# Patient Record
Sex: Female | Born: 1955
Health system: Southern US, Community
[De-identification: ages and names within clinical notes are randomized; demographics above are authoritative.]

## PROBLEM LIST (undated history)

## (undated) ENCOUNTER — Ambulatory Visit: Admission: EM | Source: Home / Self Care

## (undated) DIAGNOSIS — M1990 Unspecified osteoarthritis, unspecified site: Secondary | ICD-10-CM

## (undated) DIAGNOSIS — R011 Cardiac murmur, unspecified: Secondary | ICD-10-CM

## (undated) DIAGNOSIS — E785 Hyperlipidemia, unspecified: Secondary | ICD-10-CM

## (undated) DIAGNOSIS — T7840XA Allergy, unspecified, initial encounter: Secondary | ICD-10-CM

## (undated) HISTORY — PX: ABDOMINAL HYSTERECTOMY: SHX81

## (undated) HISTORY — DX: Allergy, unspecified, initial encounter: T78.40XA

## (undated) HISTORY — DX: Cardiac murmur, unspecified: R01.1

## (undated) HISTORY — PX: BUNIONECTOMY: SHX129

## (undated) HISTORY — DX: Hyperlipidemia, unspecified: E78.5

## (undated) HISTORY — PX: KNEE ARTHROSCOPY: SUR90

---

## 2013-02-25 ENCOUNTER — Ambulatory Visit (INDEPENDENT_AMBULATORY_CARE_PROVIDER_SITE_OTHER): Payer: 59 | Admitting: Internal Medicine

## 2013-02-25 ENCOUNTER — Encounter: Payer: Self-pay | Admitting: Internal Medicine

## 2013-02-25 VITALS — BP 110/70 | HR 70 | Temp 97.8°F | Resp 18 | Ht 61.0 in | Wt 126.0 lb

## 2013-02-25 DIAGNOSIS — L259 Unspecified contact dermatitis, unspecified cause: Secondary | ICD-10-CM

## 2013-02-25 DIAGNOSIS — I498 Other specified cardiac arrhythmias: Secondary | ICD-10-CM

## 2013-02-25 DIAGNOSIS — Z13220 Encounter for screening for lipoid disorders: Secondary | ICD-10-CM

## 2013-02-25 DIAGNOSIS — R011 Cardiac murmur, unspecified: Secondary | ICD-10-CM

## 2013-02-25 DIAGNOSIS — Z139 Encounter for screening, unspecified: Secondary | ICD-10-CM

## 2013-02-25 DIAGNOSIS — Z1329 Encounter for screening for other suspected endocrine disorder: Secondary | ICD-10-CM

## 2013-02-25 DIAGNOSIS — Z90710 Acquired absence of both cervix and uterus: Secondary | ICD-10-CM

## 2013-02-25 DIAGNOSIS — N6019 Diffuse cystic mastopathy of unspecified breast: Secondary | ICD-10-CM

## 2013-02-25 DIAGNOSIS — L309 Dermatitis, unspecified: Secondary | ICD-10-CM | POA: Insufficient documentation

## 2013-02-25 DIAGNOSIS — C4491 Basal cell carcinoma of skin, unspecified: Secondary | ICD-10-CM | POA: Insufficient documentation

## 2013-02-25 DIAGNOSIS — Z1321 Encounter for screening for nutritional disorder: Secondary | ICD-10-CM

## 2013-02-25 DIAGNOSIS — I340 Nonrheumatic mitral (valve) insufficiency: Secondary | ICD-10-CM | POA: Insufficient documentation

## 2013-02-25 DIAGNOSIS — Z130 Encounter for screening for diseases of the blood and blood-forming organs and certain disorders involving the immune mechanism: Secondary | ICD-10-CM

## 2013-02-25 DIAGNOSIS — R001 Bradycardia, unspecified: Secondary | ICD-10-CM

## 2013-02-25 DIAGNOSIS — F17200 Nicotine dependence, unspecified, uncomplicated: Secondary | ICD-10-CM | POA: Insufficient documentation

## 2013-02-25 LAB — CBC WITH DIFFERENTIAL/PLATELET
Eosinophils Absolute: 0.3 10*3/uL (ref 0.0–0.7)
HCT: 41.3 % (ref 36.0–46.0)
Hemoglobin: 14.2 g/dL (ref 12.0–15.0)
Lymphs Abs: 2.1 10*3/uL (ref 0.7–4.0)
MCH: 28.5 pg (ref 26.0–34.0)
Monocytes Absolute: 0.4 10*3/uL (ref 0.1–1.0)
Monocytes Relative: 8 % (ref 3–12)
Neutrophils Relative %: 50 % (ref 43–77)
RBC: 4.98 MIL/uL (ref 3.87–5.11)

## 2013-02-25 LAB — COMPREHENSIVE METABOLIC PANEL
AST: 17 U/L (ref 0–37)
Albumin: 4.7 g/dL (ref 3.5–5.2)
Alkaline Phosphatase: 80 U/L (ref 39–117)
BUN: 16 mg/dL (ref 6–23)
Creat: 1 mg/dL (ref 0.50–1.10)
Glucose, Bld: 92 mg/dL (ref 70–99)
Potassium: 4.7 mEq/L (ref 3.5–5.3)
Total Bilirubin: 0.6 mg/dL (ref 0.3–1.2)

## 2013-02-25 LAB — LIPID PANEL
Cholesterol: 206 mg/dL — ABNORMAL HIGH (ref 0–200)
HDL: 70 mg/dL (ref >39)
Total CHOL/HDL Ratio: 2.9 Ratio
VLDL: 17 mg/dL (ref 0–40)

## 2013-02-25 LAB — TSH: TSH: 1.267 u[IU]/mL (ref 0.350–4.500)

## 2013-02-25 NOTE — Progress Notes (Signed)
  Subjective:    Patient ID: Linda Lam, female    DOB: [birth year], 57 y.o.   MRN: [id number]  HPI  New pt here for first visit.  PMH of eczema, basal cell skin Ca of nose, Fibrocystic breast disease,  Heart murmur Tobacco use and ??? Diverticulosis at last colonoscopy.     Overall doing well has rare palpitaitons and has not had EKG in a while.  No chest pain or pressure .  She exercises quite a bit and lifts 30# weights and she does not have any exertional cardiac symptoms  She has itchy rash spot underneath L ribs on upper abd.  No blisters, burning or numbness  Allergies  Allergen Reactions  . Demerol (Meperidine)    History reviewed. No pertinent past medical history. Past Surgical History  Procedure Laterality Date  . Abdominal hysterectomy    . Bunionectomy     History   Social History  . Marital Status: Married    Spouse Name: N/A    Number of Children: N/A  . Years of Education: N/A   Occupational History  . Not on file.   Social History Main Topics  . Smoking status: Light Tobacco Smoker    Types: Cigarettes  . Smokeless tobacco: Not on file  . Alcohol Use: 0.6 oz/week    1 Glasses of wine per week  . Drug Use: No  . Sexually Active: Yes    Birth Control/ Protection: Surgical   Other Topics Concern  . Not on file   Social History Narrative  . No narrative on file   Family History  Problem Relation Age of Onset  . Hypertension Mother   . Diabetes Mother   . Stroke Father   . Diabetes Brother   . Diabetes Maternal Grandmother    Patient Active Problem List  Diagnosis  . Heart murmur  . Tobacco use disorder  . S/P hysterectomy  . Fibrocystic breast disease  . Eczema  . Skin cancer, basal cell  . Sinus bradycardia by electrocardiogram   No current outpatient prescriptions on file prior to visit.   No current facility-administered medications on file prior to visit.     Review of Systems See HPI    Objective:   Physical  Exam Physical Exam  Nursing note and vitals reviewed.  Constitutional: She is oriented to person, place, and time. She appears well-developed and well-nourished.  HENT:  Head: Normocephalic and atraumatic.  Cardiovascular: Normal rate and regular rhythm. Exam reveals no gallop and no friction rub.  Very soft II/VI SEM  LUSB  Pulmonary/Chest: Breath sounds normal. She has no wheezes. She has no rales.  Neurological: She is alert and oriented to person, place, and time.  Skin: Skin is warm and dry.   She has nummular shaped maculopapular rash just below  L ribs.   No blisters Psychiatric: She has a normal mood and affect. Her behavior is normal.        Assessment & Plan:  Heart murmur  EKG  Today sinus bradycardia  No acute changes  Will need old records  Skin rash  Eczema vs tinea  Ok to use steroid creme she has at home .  Call if not better  Basal cell skin ca  Dermatologist has recommended MOHS surgery  Tobacco use  Advised cessation Counseling given.  NOt ready to quit now   See me as needed

## 2013-02-25 NOTE — Patient Instructions (Addendum)
See me as needed 

## 2013-02-26 LAB — VITAMIN D 25 HYDROXY (VIT D DEFICIENCY, FRACTURES): Vit D, 25-Hydroxy: 20 ng/mL — ABNORMAL LOW (ref 30–89)

## 2013-03-02 ENCOUNTER — Telehealth: Payer: Self-pay | Admitting: *Deleted

## 2013-03-02 NOTE — Telephone Encounter (Signed)
Message copied by Mathews Robinsons on Tue Mar 02, 2013 10:27 AM ------      Message from: Raechel Chute D      Created: Sun Feb 28, 2013  3:57 PM       Karen Kitchens            Call pt and let her know that her vitamin D is low.  Advise her to take OTC Vitamin D3 2000 units daily            OK to mail labs to her ------

## 2013-03-02 NOTE — Telephone Encounter (Signed)
Advised pt to start OTC VIT D

## 2013-04-19 ENCOUNTER — Telehealth: Payer: Self-pay | Admitting: *Deleted

## 2013-04-19 NOTE — Telephone Encounter (Signed)
Patient called today upset because she came in for her first appointment with Dr Constance Goltz.  She thought she was coming in for her yearly exam, but when she got the bill it was coded as a medical exam.  Her insurance will not pay for it; they only pay for a yearly physical.  She states the insurance company told her that it could be recoded as a yearly, and they will pay for it.  She says she can not afford this visit and never would have come if she had known it would not be coded as her yearly exam.  She states that she Dr Constance Goltz will lose her as a patient if this can not be fixed.

## 2013-04-29 NOTE — Telephone Encounter (Signed)
Dr. Kathie Rhodes did not charge nor code this visit as a physical.  You can ask her if she wants it refiled and changed to that.  She will have to append the note though, because it does not indicate the reason for visit as a CPE.   The note looks like the primary reason for the visit was for eczema.

## 2013-05-10 NOTE — Telephone Encounter (Signed)
Thank you :)

## 2014-10-03 ENCOUNTER — Encounter: Payer: Self-pay | Admitting: Internal Medicine

## 2015-01-02 ENCOUNTER — Telehealth: Payer: Self-pay | Admitting: Family Medicine

## 2015-01-02 ENCOUNTER — Encounter: Payer: Self-pay | Admitting: Family Medicine

## 2015-01-02 ENCOUNTER — Ambulatory Visit (INDEPENDENT_AMBULATORY_CARE_PROVIDER_SITE_OTHER): Payer: 59 | Admitting: Family Medicine

## 2015-01-02 VITALS — BP 131/72 | HR 66 | Ht 61.5 in | Wt 134.0 lb

## 2015-01-02 DIAGNOSIS — M222X1 Patellofemoral disorders, right knee: Secondary | ICD-10-CM

## 2015-01-02 DIAGNOSIS — M2012 Hallux valgus (acquired), left foot: Secondary | ICD-10-CM

## 2015-01-02 DIAGNOSIS — R635 Abnormal weight gain: Secondary | ICD-10-CM

## 2015-01-02 DIAGNOSIS — M21612 Bunion of left foot: Secondary | ICD-10-CM

## 2015-01-02 DIAGNOSIS — M222X2 Patellofemoral disorders, left knee: Secondary | ICD-10-CM

## 2015-01-02 DIAGNOSIS — K130 Diseases of lips: Secondary | ICD-10-CM

## 2015-01-02 NOTE — Telephone Encounter (Signed)
Seth Bake, Will you please let patient and the front desk know that Dr. Darene Lamer is willing to accept her on his primary care patient panel since he has already seen her husband. It's up to her who she wants to see for longitudinal care.

## 2015-01-02 NOTE — Progress Notes (Signed)
CC: Linda Lam is a 59 y.o. female is here for Establish Care   Subjective: HPI:   she is requesting a annual wellness exam however have informed her that since she has an acute issues as well it would be best to make decision whether or not she gets a wellness exam for an office visit, I discussed with her that insurance may not pay for the office visit portion of our evaluation today if she chooses the annual wellness exam. She ultimately would prefer to focus on acute issues  Complains of a lesion on the lipids been present for 9 months. It does not seem to be getting bigger or worse. It does drain a clear fluid now and then. It's on the upper middle lip. Interventions have included applying lipstick and moisturizer. Does not seem to be helping. It's painful to the touch. She's never had this before. She has a history of what sounds to be squamous cell cancer of the nose and also right shin. She reports a long history of sun exposure.  Complains of bilateral knee pain localized that the anterior aspect of the knee and nonradiating. Worse when doing lunges. Worse going up and down stairs. Nothing else seems to make it better or worse. She does however mentioned that it's been worsening with the more weight that she gains. She denies locking catching or giving way nor swelling of the knees. No interventions as of yet  Complains of a growth coming out from the medial surface of the distal left foot at the base of the first metatarsal. It's been enlarging over the past years. It is painful to wear any shoes or footwear other than sandals. Pain is described only has pain, worse with friction. Occasionally swelling typically always red. No interventions as of yet but she has had surgery on her right foot for similar condition 30 years ago.  Complaint of difficulty with weight gain despite going to the gym and doing structured and also independent exercises every day of the week. This has been  complicated by frequently getting edible gifts at her work. Nothing else seems to make symptoms better or worse  Review of Systems - General ROS: negative for - chills, fever, night sweats,  weight loss Ophthalmic ROS: negative for - decreased vision Psychological ROS: negative for - anxiety or depression ENT ROS: negative for - hearing change, nasal congestion, tinnitus or allergies Hematological and Lymphatic ROS: negative for - bleeding problems, bruising or swollen lymph nodes Breast ROS: negative Respiratory ROS: no cough, shortness of breath, or wheezing Cardiovascular ROS: no chest pain or dyspnea on exertion Gastrointestinal ROS: no abdominal pain, change in bowel habits, or black or bloody stools Genito-Urinary ROS: negative for - genital discharge, genital ulcers, incontinence or abnormal bleeding from genitals Musculoskeletal ROS: negative for - joint pain or muscle painother than that described above Neurological ROS: negative for - headaches or memory loss Dermatological ROS: negative for lumps, mole changes, rash and skin lesion changesother than that described above  Past Medical History  Diagnosis Date  . Hyperlipidemia     Past Surgical History  Procedure Laterality Date  . Abdominal hysterectomy    . Bunionectomy     Family History  Problem Relation Age of Onset  . Hypertension Mother   . Diabetes Mother   . Stroke Father   . Diabetes Brother   . Diabetes Maternal Grandmother     History   Social History  . Marital Status: Married  Spouse Name: N/A    Number of Children: N/A  . Years of Education: N/A   Occupational History  . Not on file.   Social History Main Topics  . Smoking status: Light Tobacco Smoker    Types: Cigarettes  . Smokeless tobacco: Not on file  . Alcohol Use: 3.0 oz/week    5 Not specified per week  . Drug Use: No  . Sexual Activity:    Partners: Male    Birth Control/ Protection: Surgical   Other Topics Concern  . Not on  file   Social History Narrative     Objective: BP 131/72 mmHg  Pulse 66  Ht 5' 1.5" (1.562 m)  Wt 134 lb (60.782 kg)  BMI 24.91 kg/m2  LMP 02/25/1998  General: Alert and Oriented, No Acute Distress HEENT: Pupils equal, round, reactive to light. Conjunctivae clear.  Moist mucous membranes Lungs: Clear to auscultation bilaterally, no wheezing/ronchi/rales.  Comfortable work of breathing. Good air movement. Cardiac: Regular rate and rhythm. Normal S1/S2.  Grade 1/6 systolic murmur at second left intercostal space, rubs, nor gallops.   Extremities: No peripheral edema.  Strong peripheral pulses.full range of motion and strength of both lower extremities.  Hallux valgus to a moderate degree in the left foot Mental Status: No depression, anxiety, nor agitation. Skin: Warm and dry. 3 mm diameter shallow ulceration at the superiormost aspect of the upper lip directly in the center of her lip. Tender to touch but no drainage.  Assessment & Plan: Linda Lam was seen today for establish care.  Diagnoses and associated orders for this visit:  Bunion, left - Ambulatory referral to Podiatry  Patellofemoral arthralgia of both knees  Lip lesion  Abnormal weight gain    Bunion: She is interested in surgery for relief of her pain therefore referral to podiatry Patellofemoral syndrome of both knees: She was given a handout reflecting home rehabilitative exercises that can be done to help improve this pain of the knee. Discussed that her lip lesion is concerning for squamous or basal cell carcinoma, even though it's covered in lipstick and makeup today I still think it warrants dermatology attention, she prefer to show this to her plastic surgeon during her scheduled appointment next week for second opinion. I discussed with her that I think he needs a biopsy and that this should be done by a specialist since it's involving the lips. Abnormal weight gain: offered thyroid stimulated hormone to be  checked today however I informed her that most likely this would be free if she waits to have this done when she comes back for complete physical exam, she prefers to do it at follow-up.   Return for 1-3 months for CPE.

## 2015-01-03 NOTE — Telephone Encounter (Signed)
Message left on vm;routed to [employer]

## 2015-01-24 ENCOUNTER — Telehealth: Payer: Self-pay | Admitting: *Deleted

## 2015-01-24 DIAGNOSIS — Z1231 Encounter for screening mammogram for malignant neoplasm of breast: Secondary | ICD-10-CM

## 2015-01-24 NOTE — Telephone Encounter (Signed)
Pt called wanting to know if she needs to get her mammogram  Before she sees Dr. Darene Lamer. ( She wants to establish care with him;her husband is his patient)  mammo order place

## 2015-02-01 ENCOUNTER — Ambulatory Visit (INDEPENDENT_AMBULATORY_CARE_PROVIDER_SITE_OTHER): Payer: 59

## 2015-02-01 DIAGNOSIS — Z1231 Encounter for screening mammogram for malignant neoplasm of breast: Secondary | ICD-10-CM

## 2015-02-02 ENCOUNTER — Ambulatory Visit: Payer: 59

## 2015-06-26 ENCOUNTER — Encounter: Payer: 59 | Admitting: Sports Medicine

## 2015-06-27 ENCOUNTER — Ambulatory Visit (INDEPENDENT_AMBULATORY_CARE_PROVIDER_SITE_OTHER): Payer: 59 | Admitting: Sports Medicine

## 2015-06-27 ENCOUNTER — Encounter: Payer: Self-pay | Admitting: Sports Medicine

## 2015-06-27 VITALS — BP 114/71 | HR 58 | Ht 61.0 in | Wt 128.0 lb

## 2015-06-27 DIAGNOSIS — I341 Nonrheumatic mitral (valve) prolapse: Secondary | ICD-10-CM

## 2015-06-27 DIAGNOSIS — Z0000 Encounter for general adult medical examination without abnormal findings: Secondary | ICD-10-CM | POA: Diagnosis not present

## 2015-06-27 DIAGNOSIS — M2242 Chondromalacia patellae, left knee: Secondary | ICD-10-CM | POA: Diagnosis not present

## 2015-06-27 DIAGNOSIS — M25511 Pain in right shoulder: Secondary | ICD-10-CM | POA: Diagnosis not present

## 2015-06-27 DIAGNOSIS — R635 Abnormal weight gain: Secondary | ICD-10-CM | POA: Insufficient documentation

## 2015-06-27 DIAGNOSIS — M1711 Unilateral primary osteoarthritis, right knee: Secondary | ICD-10-CM | POA: Insufficient documentation

## 2015-06-27 DIAGNOSIS — M1612 Unilateral primary osteoarthritis, left hip: Secondary | ICD-10-CM

## 2015-06-27 DIAGNOSIS — M170 Bilateral primary osteoarthritis of knee: Secondary | ICD-10-CM | POA: Insufficient documentation

## 2015-06-27 MED ORDER — MELOXICAM 15 MG PO TABS: ORAL_TABLET | ORAL | Status: DC

## 2015-06-27 NOTE — Assessment & Plan Note (Signed)
X-rays, meloxicam, formal PT

## 2015-06-27 NOTE — Assessment & Plan Note (Signed)
Goal weight is 120 pounds. We will try dieting first, if no improvement we will add a weight loss medication.

## 2015-06-27 NOTE — Assessment & Plan Note (Signed)
We do need an updated echocardiogram.

## 2015-06-27 NOTE — Progress Notes (Signed)
  Subjective:    CC: Multiple pains, complete physical  HPI: This is a pleasant 59 year female who is here for complete physical exam. She does desire some routine blood work but has multiple complaints.  Knee pain: Left-sided, under the kneecap, worse going down stairs, moderate, persistent.  Right shoulder pain: Localized over the acromial clavicular joint and worse with overhead activities, moderate, persistent without radiation  Left hip pain: Moderate, persistent, localized in the groin, no radiation.  Abnormal weight gain: Desires to get down to 120 pounds, has been having great difficulty with diet and exercise, she does spend a great time in the gym, and is a fairly aggressive power lifter.  Past medical history, Surgical history, Family history not pertinant except as noted below, Social history, Allergies, and medications have been entered into the medical record, reviewed, and no changes needed.   Review of Systems: No fevers, chills, night sweats, weight loss, chest pain, or shortness of breath.   Objective:    General: Well Developed, well nourished, and in no acute distress.  Neuro: Alert and oriented x3, extra-ocular muscles intact, sensation grossly intact.  HEENT: Normocephalic, atraumatic, pupils equal round reactive to light, neck supple, no masses, no lymphadenopathy, thyroid nonpalpable.  Skin: Warm and dry, no rashes. Cardiac: Regular rate and rhythm, no murmurs rubs or gallops, no lower extremity edema.  Respiratory: Clear to auscultation bilaterally. Not using accessory muscles, speaking in full sentences. Left Knee: Normal to inspection with no erythema or effusion or obvious bony abnormalities. Tender to palpation under the medial and lateral patellar facets ROM normal in flexion and extension and lower leg rotation. Ligaments with solid consistent endpoints including ACL, PCL, LCL, MCL. Negative Mcmurray's and provocative meniscal tests. Non painful  patellar compression. Patellar and quadriceps tendons unremarkable. Hamstring and quadriceps strength is normal. Left Hip: ROM IR: 60 Deg, ER: 60 Deg, Flexion: 120 Deg, Extension: 100 Deg, Abduction: 45 Deg, Adduction: 45 Deg, reproduction of pain with internal rotation Strength IR: 5/5, ER: 5/5, Flexion: 5/5, Extension: 5/5, Abduction: 4 minus /5, Adduction: 5/5 Pelvic alignment unremarkable to inspection and palpation. Standing hip rotation and gait without trendelenburg / unsteadiness. Greater trochanter without tenderness to palpation. No tenderness over piriformis. No SI joint tenderness and normal minimal SI movement. Right Shoulder: Inspection reveals no abnormalities, atrophy or asymmetry. Tender to palpation over the acromioclavicular joint with a positive cross arm test ROM is full in all planes. Rotator cuff strength normal throughout. Positive Neer and Hawkin's tests, empty can. Speeds and Yergason's tests normal. No labral pathology noted with negative Obrien's, negative crank, negative clunk, and good stability. Normal scapular function observed. No painful arc and no drop arm sign. No apprehension sign  Impression and Recommendations:    I spent 40 minutes with this patient, greater than 50% was face-to-face time counseling regarding the above diagnoses

## 2015-06-27 NOTE — Assessment & Plan Note (Signed)
Checking routine blood work.  Up-to-date on colon cancer screening, breast cancer screening, post hysterectomy, no further Pap smears needed.

## 2015-07-05 ENCOUNTER — Ambulatory Visit (HOSPITAL_BASED_OUTPATIENT_CLINIC_OR_DEPARTMENT_OTHER)
Admission: RE | Admit: 2015-07-05 | Discharge: 2015-07-05 | Disposition: A | Discharge status: Home / Self Care | Payer: 59 | Source: Ambulatory Visit | Attending: Sports Medicine | Admitting: Sports Medicine

## 2015-07-05 DIAGNOSIS — I371 Nonrheumatic pulmonary valve insufficiency: Secondary | ICD-10-CM | POA: Insufficient documentation

## 2015-07-05 DIAGNOSIS — M25561 Pain in right knee: Secondary | ICD-10-CM | POA: Diagnosis not present

## 2015-07-05 DIAGNOSIS — M2242 Chondromalacia patellae, left knee: Secondary | ICD-10-CM

## 2015-07-05 DIAGNOSIS — M25562 Pain in left knee: Secondary | ICD-10-CM | POA: Insufficient documentation

## 2015-07-05 DIAGNOSIS — I071 Rheumatic tricuspid insufficiency: Secondary | ICD-10-CM | POA: Insufficient documentation

## 2015-07-05 DIAGNOSIS — M1612 Unilateral primary osteoarthritis, left hip: Secondary | ICD-10-CM

## 2015-07-05 DIAGNOSIS — I341 Nonrheumatic mitral (valve) prolapse: Secondary | ICD-10-CM | POA: Diagnosis present

## 2015-07-05 DIAGNOSIS — M25552 Pain in left hip: Secondary | ICD-10-CM | POA: Diagnosis not present

## 2015-07-05 DIAGNOSIS — I340 Nonrheumatic mitral (valve) insufficiency: Secondary | ICD-10-CM | POA: Diagnosis not present

## 2015-07-05 DIAGNOSIS — M19011 Primary osteoarthritis, right shoulder: Secondary | ICD-10-CM | POA: Insufficient documentation

## 2015-07-05 DIAGNOSIS — M25511 Pain in right shoulder: Secondary | ICD-10-CM

## 2015-07-05 NOTE — Progress Notes (Signed)
  Echocardiogram 2D Echocardiogram has been performed.  Lysle Rubens 07/05/2015, 9:56 AM

## 2015-07-08 LAB — COMPREHENSIVE METABOLIC PANEL
ALT: 15 U/L (ref 6–29)
Albumin: 4.3 g/dL (ref 3.6–5.1)
Alkaline Phosphatase: 78 U/L (ref 33–130)
BUN: 16 mg/dL (ref 7–25)
CO2: 25 mmol/L (ref 20–31)
Chloride: 104 mmol/L (ref 98–110)
Creat: 0.88 mg/dL (ref 0.50–1.05)
Sodium: 141 mmol/L (ref 135–146)
Total Protein: 6.2 g/dL (ref 6.1–8.1)

## 2015-07-08 LAB — LIPID PANEL
Cholesterol: 163 mg/dL (ref 125–200)
HDL: 67 mg/dL (ref >=46)
LDL Cholesterol: 82 mg/dL (ref <130)
Total CHOL/HDL Ratio: 2.4 Ratio (ref <=5.0)
Triglycerides: 70 mg/dL (ref <150)
VLDL: 14 mg/dL (ref <30)

## 2015-07-08 LAB — HEMOGLOBIN A1C
Hgb A1c MFr Bld: 5.8 % — ABNORMAL HIGH (ref <5.7)
Mean Plasma Glucose: 120 mg/dL — ABNORMAL HIGH (ref <117)

## 2015-07-08 LAB — COMPREHENSIVE METABOLIC PANEL WITH GFR
AST: 16 U/L (ref 10–35)
Calcium: 9.4 mg/dL (ref 8.6–10.4)
Glucose, Bld: 88 mg/dL (ref 65–99)
Potassium: 5 mmol/L (ref 3.5–5.3)
Total Bilirubin: 0.5 mg/dL (ref 0.2–1.2)

## 2015-07-08 LAB — VITAMIN D 25 HYDROXY (VIT D DEFICIENCY, FRACTURES): Vit D, 25-Hydroxy: 30 ng/mL (ref 30–100)

## 2015-07-08 LAB — CBC
HCT: 40.3 % (ref 36.0–46.0)
Hemoglobin: 13.6 g/dL (ref 12.0–15.0)
MCH: 28.6 pg (ref 26.0–34.0)
MCHC: 33.7 g/dL (ref 30.0–36.0)
MCV: 84.7 fL (ref 78.0–100.0)
MPV: 11 fL (ref 8.6–12.4)
Platelets: 213 10*3/uL (ref 150–400)
RBC: 4.76 MIL/uL (ref 3.87–5.11)
RDW: 14 % (ref 11.5–15.5)
WBC: 5.3 10*3/uL (ref 4.0–10.5)

## 2015-07-08 LAB — TSH: TSH: 1.513 u[IU]/mL (ref 0.350–4.500)

## 2015-08-02 ENCOUNTER — Encounter: Payer: Self-pay | Admitting: Sports Medicine

## 2015-08-02 ENCOUNTER — Ambulatory Visit (INDEPENDENT_AMBULATORY_CARE_PROVIDER_SITE_OTHER): Payer: 59 | Admitting: Sports Medicine

## 2015-08-02 VITALS — BP 130/80 | HR 77 | Wt 127.0 lb

## 2015-08-02 DIAGNOSIS — L989 Disorder of the skin and subcutaneous tissue, unspecified: Secondary | ICD-10-CM | POA: Insufficient documentation

## 2015-08-02 DIAGNOSIS — I340 Nonrheumatic mitral (valve) insufficiency: Secondary | ICD-10-CM

## 2015-08-02 DIAGNOSIS — R635 Abnormal weight gain: Secondary | ICD-10-CM

## 2015-08-02 DIAGNOSIS — Z0000 Encounter for general adult medical examination without abnormal findings: Secondary | ICD-10-CM

## 2015-08-02 NOTE — Assessment & Plan Note (Signed)
Overall blood work looks good, A1c is in the prediabetic range we will work on cutting back hydrate's and weight loss.

## 2015-08-02 NOTE — Assessment & Plan Note (Signed)
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

## 2015-08-02 NOTE — Progress Notes (Signed)
  Subjective:    CC: follow-up  HPI: Abnormal weight gain: Has only lost a single pound, would like to start phentermine when she gets back from a binge of business trips and alcohol.  Multiple orthopedic issues: Has not yet done physical therapy, she did notice fantastic improvement when on meloxicam, and is amenable to restart physical therapy when she gets back from her trip, and continue her meloxicam.  Skin lesion: Right-sided lower leg,Small reddish papule.  Question mitral valve prolapse: Echo was essentially negative. Only trace mitral regurgitation.  Past medical history, Surgical history, Family history not pertinant except as noted below, Social history, Allergies, and medications have been entered into the medical record, reviewed, and no changes needed.   Review of Systems: No fevers, chills, night sweats, weight loss, chest pain, or shortness of breath.   Objective:    General: Well Developed, well nourished, and in no acute distress.  Neuro: Alert and oriented x3, extra-ocular muscles intact, sensation grossly intact.  HEENT: Normocephalic, atraumatic, pupils equal round reactive to light, neck supple, no masses, no lymphadenopathy, thyroid nonpalpable.  Skin: Warm and dry, no rashes.  there is a subcentimeter reddish papule on her right inner thigh Cardiac: Regular rate and rhythm, no murmurs rubs or gallops, no lower extremity edema.  Respiratory: Clear to auscultation bilaterally. Not using accessory muscles, speaking in full sentences.  Procedure:  Cryodestruction of right inner thigh papule Consent obtained and verified. Time-out conducted. Noted no overlying erythema, induration, or other signs of local infection. Completed without difficulty using Cryo-Gun. Advised to call if fevers/chills, erythema, induration, drainage, or persistent bleeding.  Impression and Recommendations:    I spent 40 minutes with this patient, greater than 50% was face-to-face time  counseling regarding the above diagnoses

## 2015-08-02 NOTE — Assessment & Plan Note (Signed)
When patient returns we will discuss starting phentermine.

## 2015-08-02 NOTE — Assessment & Plan Note (Signed)
Asymptomatic, no evidence of mitral valve prolapse. No further evaluation needed.

## 2015-09-25 ENCOUNTER — Ambulatory Visit (INDEPENDENT_AMBULATORY_CARE_PROVIDER_SITE_OTHER): Payer: 59 | Admitting: Rehabilitative and Restorative Service Providers"

## 2015-09-25 ENCOUNTER — Encounter: Payer: Self-pay | Admitting: Rehabilitative and Restorative Service Providers"

## 2015-09-25 DIAGNOSIS — M25562 Pain in left knee: Secondary | ICD-10-CM

## 2015-09-25 DIAGNOSIS — Z7409 Other reduced mobility: Secondary | ICD-10-CM

## 2015-09-25 DIAGNOSIS — R293 Abnormal posture: Secondary | ICD-10-CM | POA: Diagnosis not present

## 2015-09-25 NOTE — Therapy (Signed)
[facility] [place], [place], [address] Phone: [phone]   Fax:  [phone]  Physical Therapy Evaluation  Patient Details  Name: Linda Lam MRN: [id number] Date of Birth: [birth year] Referring Provider: Dr. [name]  Encounter Date: 09/25/2015      PT End of Session - 09/25/15 1715    Visit Number 1   Number of Visits 12   Date for PT Re-Evaluation 11/06/15   PT Start Time 3500   PT Stop Time 1655   PT Time Calculation (min) 50 min   Activity Tolerance Patient tolerated treatment well      Past Medical History  Diagnosis Date  . Hyperlipidemia     Past Surgical History  Procedure Laterality Date  . Abdominal hysterectomy    . Bunionectomy      There were no vitals filed for this visit.  Visit Diagnosis:  Left knee pain - Plan: PT plan of care cert/re-cert  Abnormal posture - Plan: PT plan of care cert/re-cert  Impaired mobility and endurance - Plan: PT plan of care cert/re-cert      Subjective Assessment - 09/25/15 1612    Subjective Patient reports pain under her Lt knee cap which has been present for several years. She has pain with squats; running; certain exercise classes at the gym. She can hear the knee crunching.    Pertinent History No known injuries to LE's    How long can you sit comfortably? no limit   How long can you stand comfortably? pain with standing after she has been sitting for >30 min    How long can you walk comfortably? no limit   Diagnostic tests xrays    Patient Stated Goals get rid og pain and learn exercises for knee   Currently in Pain? Yes   Pain Score 1    Pain Location Hip   Pain Orientation Left   Pain Descriptors / Indicators Nagging   Pain Type Chronic pain   Pain Radiating Towards stays in the hip   Pain Onset More than a month ago   Pain Frequency Constant   Aggravating Factors  body flow class at the gym - in certain positions;  lifting leg out to side   Pain Relieving Factors massage            [facility] PT Assessment - 09/25/15 0001    Assessment   Medical Diagnosis chondramalacia patella Rt   Referring Provider Dr. [name]   Onset Date/Surgical Date 09/17/15   Hand Dominance Right   Next MD Visit 10/03/15   Precautions   Precautions None   Balance Screen   Has the patient fallen in the past 6 months No   Has the patient had a decrease in activity level because of a fear of falling?  No   Is the patient reluctant to leave their home because of a fear of falling?  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   Leisure working out at the gym 7 days/wk  - classes/aerobic walking on track/treadmill/elipitical; [employer] - household chores   Observation/Other Assessments   Focus on Therapeutic Outcomes (FOTO)  32% limitation    Sensation   Additional Comments WFL's LE's   AROM   Right/Left Hip --  WFL's    Right/Left Knee --  WFL's   Strength   Right/Left Hip --  5/5    Right/Left Knee --  5/5   Flexibility   Hamstrings WFL's   Quadriceps heel 5-6 in from buttocks Lt tighter than Rt   ITB tight bilat Lt > Rt   Piriformis tight Rt > Lt   Palpation   Patella mobility +grind    Palpation comment tight lateral thigh/IT band   Special Tests    Special Tests --  poor tracking Lt patella with knee flex/ext                   [facility] Adult PT Treatment/Exercise - 09/25/15 0001    Posture/Postural Control   Posture Comments standing with knees hyperextended; hips in ER    Self-Care   Self-Care --  ball release work hip flexors   Knee/Hip Exercises: Public affairs consultant 3 reps;30 seconds  with hip extended knee on foam roll   Hip Flexor Stretch 3 reps;30 seconds  knees to chest hold Rt flexed Lt extends over edge of bed    ITB Stretch 3 reps;30 seconds   Knee/Hip Exercises: Supine   Straight Leg Raise with External Rotation 10 reps  10 sec hold   Other Supine Knee/Hip Exercises adductor ball squeeze 10 sec hold 10 reps                PT Education - 09/25/15 1707    Education provided Yes   Education Details avoid hyperextensing knees in standing; HEP   Person(s) Educated Patient   Methods Explanation;Demonstration;Tactile cues;Verbal cues;Handout   Comprehension Verbalized understanding;Returned demonstration;Verbal cues required;Tactile cues required             PT Long Term Goals - 09/25/15 1725    PT LONG TERM GOAL #1   Title Pt stands without hyperextending knees 10/23/15   Time 4   Period Weeks   Status New   PT LONG TERM GOAL #2   Title Pt demo good contraction of medial quad 10/23/15   Time 4   Period Weeks   Status New   PT LONG TERM GOAL #3   Title Pt reports decreaesd pain with standing after sitting for 45-30 min 11/06/15   Time 6   Period Weeks   Status New   PT LONG TERM GOAL #4   Title Pt I in HEP for gym and home at d/c 11/06/15   Time 6   Period Weeks   Status New   PT LONG TERM GOAL #5   Title Patient improves on FOTO to</=32% limitation 11/06/15   Time 6   Period Weeks   Status New               Plan - 09/25/15 1716    Clinical Impression Statement Pt presents with signs and symptoms consistent with Lt chondramalacia patella including; pain with descending steps/squats/standing after sitting for > 30 min. she has oor patellar tracking; muscular imbalance with tight hip musculature and lat quad and weaker medial quad; pain with palpation. Pt will benefit from Physical Therapy to address problems identified and improve functioinal level.    Pt will benefit from skilled therapeutic intervention in order to improve on the following deficits Pain;Increased fascial restricitons;Decreased activity tolerance;Decreased endurance   Rehab Potential Good   PT  Frequency 2x / week   PT Duration 6 weeks   PT Treatment/Interventions Patient/family education;ADLs/Self Care Home Management;Therapeutic activities;Therapeutic exercise;Taping;Cryotherapy;Electrical Stimulation;Moist Heat;Ultrasound;Neuromuscular re-education;Manual techniques   PT Next Visit  Plan taping as needed; stretch piriformis and hip abductors; strengthening medial quad; work on neuromuscular re-ed for proper standing avoiding hyperextension for knees   PT Home Exercise Plan standing without hyperextending knees; stretching; myofacila bal release hip flexors; HEP   Consulted and Agree with Plan of Care Patient         Problem List Patient Active Problem List   Diagnosis Date Noted  . Skin lesion of right leg 08/02/2015  . Annual physical exam 06/27/2015  . Chondromalacia of left patellofemoral joint 06/27/2015  . Primary osteoarthritis of left hip 06/27/2015  . Right shoulder pain 06/27/2015  . Abnormal weight gain 06/27/2015  . Trace mitral regurgitation by prior echocardiogram 02/25/2013  . Tobacco use disorder 02/25/2013  . S/P hysterectomy 02/25/2013  . Fibrocystic breast disease 02/25/2013  . Eczema 02/25/2013  . Skin cancer, basal cell 02/25/2013  . Sinus bradycardia by electrocardiogram 02/25/2013    Linda Lam PT, MPH 09/25/2015, 5:30 PM  [facility] [place], [place], [address] Phone: [phone]   Fax:  [phone]  Name: Linda Lam MRN: [id number] Date of Birth: [birth year]

## 2015-09-25 NOTE — Patient Instructions (Signed)
Ball release work through hip flexors with ~4 inch rubber ball   Hip Flexor, Quadricep Stretch: Belly Down (Strap)    Engage stable pelvic tilt. Hold top of foot with hand or strap. Hold for __30 sec. Repeat __3__ times each leg. Can have foam roll under distal thigh.    Quads / HF, Supine    Lie near edge of bed, pull both legs toward chest and hold one knee to chest. Other leg hanging over edge, relaxed, stretch.  Hold _30__ seconds.  Repeat _3__ times per session. Do _2-3 __ sessions per day.   HIP: Hamstrings - Supine    Place strap around foot. Raise leg up, keep knee straight. Pull leg across body with leg straight.  Hold _39__ seconds. _3__ reps per set, _2-3__ sets per day     Straight Leg Raise: With External Leg Rotation    Lie on back with right leg straight, opposite leg bent. Rotate straight leg out and lift __8-10__ inches. Repeat __10__ times per set. Do _1-3___ sets per session. Do _2___ sessions per day.  Adduction: Hip - Knees Together (Hook-Lying)    Lie with hips and knees bent, towel roll between knees. Push knees together. Hold for _10__ seconds. Repeat _10-30__ times. Do __2_ times a day.

## 2015-10-02 ENCOUNTER — Ambulatory Visit (INDEPENDENT_AMBULATORY_CARE_PROVIDER_SITE_OTHER): Payer: 59 | Admitting: Physical Therapy

## 2015-10-02 DIAGNOSIS — M25562 Pain in left knee: Secondary | ICD-10-CM

## 2015-10-02 DIAGNOSIS — Z7409 Other reduced mobility: Secondary | ICD-10-CM

## 2015-10-02 DIAGNOSIS — R293 Abnormal posture: Secondary | ICD-10-CM | POA: Diagnosis not present

## 2015-10-02 NOTE — Therapy (Signed)
[place] [facility] [place], [place], [address] Phone: [phone]   Fax:  [phone]  Physical Therapy Treatment  Patient Details  Name: Linda Lam MRN: [id number] Date of Birth: Nov 12, 1956 Referring Provider: Dr. [name]  Encounter Date: 10/02/2015      PT End of Session - 10/02/15 1605    Visit Number 2   Number of Visits 12   Date for PT Re-Evaluation 11/06/15   PT Start Time 4481   PT Stop Time 1655   PT Time Calculation (min) 50 min      Past Medical History  Diagnosis Date  . Hyperlipidemia     Past Surgical History  Procedure Laterality Date  . Abdominal hysterectomy    . Bunionectomy      There were no vitals filed for this visit.  Visit Diagnosis:  Left knee pain  Abnormal posture  Impaired mobility and endurance      Subjective Assessment - 10/02/15 1608    Subjective Pt reports if she sits too long her Lt knee is painful.  Goes to gym every night,  landing from jump, squats, going down steps.    Patient Stated Goals get rid of pain and learn exercises for knee   Currently in Pain? No/denies            [facility] PT Assessment - 10/02/15 0001    Assessment   Medical Diagnosis chondramalacia patella Rt   Onset Date/Surgical Date 09/17/15   Hand Dominance Right   Next MD Visit 10/03/15          [facility] Adult PT Treatment/Exercise - 10/02/15 0001    Exercises   Exercises Knee/Hip   Knee/Hip Exercises: Stretches   Passive Hamstring Stretch Right;Left;3 reps;30 seconds   Quad Stretch Right;Left;1 rep;30 seconds   ITB Stretch 3 reps;Right;Left;30 seconds   Other Knee/Hip Stretches Taught pt how to use ball for self MFR to lateral thighs on each side.    Other Knee/Hip Stretches Adductor stretch with strap in supine, 30 sec x 1 reps each side.    Knee/Hip Exercises: Aerobic   Nustep L4: 5 min    Knee/Hip Exercises: Standing   Step Down Right;2 sets;10 reps;Hand  Hold: 1  3 " step. 6" too painful   Step Down Limitations (6 reps on 6"  step; stopped)   Knee/Hip Exercises: Supine   Straight Leg Raise with External Rotation 10 reps;2 sets   Other Supine Knee/Hip Exercises Long sitting: hip flexion with hip abd/add x 10 reps x 2 sets, each leg     Manual therapy: Dynamic tape applied to lateral Lt knee pulling patella medially to assist in improved tracking with dynamic motions.          PT Education - 10/02/15 1643    Education provided Yes   Education Details HEP   Person(s) Educated Patient   Methods Handout;Explanation   Comprehension Verbalized understanding;Returned demonstration             PT Long Term Goals - 09/25/15 1725    PT LONG TERM GOAL #1   Title Pt stands without hyperextending knees 10/23/15   Time 4   Period Weeks   Status New   PT LONG TERM GOAL #2   Title Pt demo good contraction of medial quad 10/23/15   Time 4   Period Weeks   Status New   PT LONG TERM GOAL #3   Title Pt reports decreaesd pain with standing after sitting  for 45-30 min 11/06/15   Time 6   Period Weeks   Status New   PT LONG TERM GOAL #4   Title Pt I in HEP for gym and home at d/c 11/06/15   Time 6   Period Weeks   Status New   PT LONG TERM GOAL #5   Title Patient improves on FOTO to</=32% limitation 11/06/15   Time 6   Period Weeks   Status New               Plan - 10/02/15 1657    Clinical Impression Statement Pt tolerated all exercises without increase in pain, except heel taps from 6" step.  This caused some Lt knee pain and also back pain; noted compensatory strategies during eccentric lowering.  Instructed pt to only perform 3' heel taps, should not be painful.  Pt required frequent cues for form and also tips on modifications for exercises at gym to decrease knee pain.  No goals met yet; only 2nd visit.    Pt will benefit from skilled therapeutic intervention in order to improve on the following deficits Pain;Increased  fascial restricitons;Decreased activity tolerance;Decreased endurance   Rehab Potential Good   PT Frequency 2x / week   PT Duration 6 weeks   PT Treatment/Interventions Patient/family education;ADLs/Self Care Home Management;Therapeutic activities;Therapeutic exercise;Taping;Cryotherapy;Electrical Stimulation;Moist Heat;Ultrasound;Neuromuscular re-education;Manual techniques   PT Next Visit Plan Assess response from new exercises and taping to the Lt knee for improved tracking of patella.  Continue progressive medial quad/ hip strengthening and lateral thigh stretching.    Consulted and Agree with Plan of Care Patient        Problem List Patient Active Problem List   Diagnosis Date Noted  . Skin lesion of right leg 08/02/2015  . Annual physical exam 06/27/2015  . Chondromalacia of left patellofemoral joint 06/27/2015  . Primary osteoarthritis of left hip 06/27/2015  . Right shoulder pain 06/27/2015  . Abnormal weight gain 06/27/2015  . Trace mitral regurgitation by prior echocardiogram 02/25/2013  . Tobacco use disorder 02/25/2013  . S/P hysterectomy 02/25/2013  . Fibrocystic breast disease 02/25/2013  . Eczema 02/25/2013  . Skin cancer, basal cell 02/25/2013  . Sinus bradycardia by electrocardiogram 02/25/2013   Kerin Perna, PTA 10/02/2015 5:05 PM  [place] [facility] [place], [place], [address] Phone: [phone]   Fax:  [phone]  Name: Kymberley Raz MRN: [id number] Date of Birth: June 27, 1956

## 2015-10-02 NOTE — Patient Instructions (Signed)
  Outer Hip Stretch: Reclined IT Band Stretch (Strap)   Strap around one foot, pull leg across body until you feel a pull or stretch, with shoulders on mat.  (opposite toes pointed upward)  Hold for 30 seconds. Repeat 3 times each leg. 2x  times/day. Straight Leg Raise: With External Leg Rotation    Lie on back with right leg straight, opposite leg bent. Rotate straight leg out and lift __6-8__ inches. Repeat _10___ times per set.Straight Leg Raise: With External Leg Rotation  Straight Leg Raise    Bend right leg. Keep other leg as straight as possible and tighten muscles on top of thigh. Slowly lift straight leg ___3_ inches from bed, toes out- lift leg up, out, in, down.  Repeat 10 x, 1-2 sets. 1 x/day .   Anterior Step-Down    Stand with both feet on ___ inch step. Tap Rt foot, touching heel to 3" step, repeat 10 x, 1-3 sets  Hold on for balance.    [facility] [place], [place] [address]  [phone] (office) [phone] (fax)

## 2015-10-03 ENCOUNTER — Encounter: Payer: Self-pay | Admitting: Sports Medicine

## 2015-10-03 ENCOUNTER — Ambulatory Visit (INDEPENDENT_AMBULATORY_CARE_PROVIDER_SITE_OTHER): Payer: 59 | Admitting: Sports Medicine

## 2015-10-03 VITALS — BP 143/80 | HR 67 | Ht 61.0 in | Wt 129.0 lb

## 2015-10-03 DIAGNOSIS — R635 Abnormal weight gain: Secondary | ICD-10-CM | POA: Diagnosis not present

## 2015-10-03 DIAGNOSIS — M7712 Lateral epicondylitis, left elbow: Secondary | ICD-10-CM

## 2015-10-03 MED ORDER — TRETINOIN 0.1 % EX CREA: TOPICAL_CREAM | Freq: Every day | CUTANEOUS | Status: DC

## 2015-10-03 MED ORDER — PHENTERMINE HCL 37.5 MG PO TABS: ORAL_TABLET | ORAL | Status: DC

## 2015-10-03 NOTE — Assessment & Plan Note (Signed)
Adding formal PT.

## 2015-10-03 NOTE — Progress Notes (Signed)
  Subjective:    CC: follow-up  HPI: Abnormal weight gain: Desires to now start phentermine.  Left elbow pain: Moderate, persistent, localized over the mid to proximal common extensor tendon origin, desires an order for formal physical therapy.  Past medical history, Surgical history, Family history not pertinant except as noted below, Social history, Allergies, and medications have been entered into the medical record, reviewed, and no changes needed.   Review of Systems: No fevers, chills, night sweats, weight loss, chest pain, or shortness of breath.   Objective:    General: Well Developed, well nourished, and in no acute distress.  Neuro: Alert and oriented x3, extra-ocular muscles intact, sensation grossly intact.  HEENT: Normocephalic, atraumatic, pupils equal round reactive to light, neck supple, no masses, no lymphadenopathy, thyroid nonpalpable.  Skin: Warm and dry, no rashes. Cardiac: Regular rate and rhythm, no murmurs rubs or gallops, no lower extremity edema.  Respiratory: Clear to auscultation bilaterally. Not using accessory muscles, speaking in full sentences. Left Elbow: Unremarkable to inspection. Range of motion full pronation, supination, flexion, extension. Strength is full to all of the above directions Stable to varus, valgus stress. Negative moving valgus stress test. Tender to palpation at the common extensor tendon origin as well as over the mid common extensor tendon Ulnar nerve does not sublux. Negative cubital tunnel Tinel's.  Impression and Recommendations:

## 2015-10-03 NOTE — Assessment & Plan Note (Signed)
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

## 2015-10-09 ENCOUNTER — Telehealth: Payer: Self-pay

## 2015-10-09 DIAGNOSIS — L700 Acne vulgaris: Secondary | ICD-10-CM

## 2015-10-09 NOTE — Telephone Encounter (Signed)
Pt states her rx for Retin-A is not being covered by her insurance. Would like to know if dx could be changed to have it covered.

## 2015-10-10 MED ORDER — TRETINOIN 0.1 % EX CREA: TOPICAL_CREAM | Freq: Every day | CUTANEOUS | Status: DC

## 2015-10-10 NOTE — Addendum Note (Signed)
Addended by: Elizabeth Sauer on: 10/10/2015 01:16 PM   Modules accepted: Orders

## 2015-10-10 NOTE — Telephone Encounter (Signed)
Resubmit with a diagnosis of acne vulgaris.

## 2015-10-16 ENCOUNTER — Telehealth: Payer: Self-pay | Admitting: Sports Medicine

## 2015-10-16 NOTE — Telephone Encounter (Signed)
Received fax from [place] they denied coverage on Tretinoin due to medication is a plan exclusion. - CF

## 2015-10-16 NOTE — Telephone Encounter (Signed)
Received fax for prior authorization on Trentinoin sent through cover my meds waiting on clinical questions. - CF

## 2015-10-19 ENCOUNTER — Ambulatory Visit (INDEPENDENT_AMBULATORY_CARE_PROVIDER_SITE_OTHER): Payer: 59 | Admitting: Rehabilitative and Restorative Service Providers"

## 2015-10-19 DIAGNOSIS — Z7409 Other reduced mobility: Secondary | ICD-10-CM

## 2015-10-19 DIAGNOSIS — M25522 Pain in left elbow: Secondary | ICD-10-CM

## 2015-10-19 DIAGNOSIS — R293 Abnormal posture: Secondary | ICD-10-CM

## 2015-10-19 DIAGNOSIS — M25562 Pain in left knee: Secondary | ICD-10-CM

## 2015-10-19 NOTE — Therapy (Addendum)
[place], [place], [address] Phone: [phone]   Fax:  [phone]  Physical Therapy Treatment  Patient Details  Name: Linda Lam MRN: [id number] Date of Birth: 07-Jun-1956 Referring Provider: Dr. [name]   Encounter Date: 10/19/2015      PT End of Session - 10/19/15 1659    Visit Number 3   Number of Visits 12   Date for PT Re-Evaluation 11/06/15   PT Start Time 0626   PT Stop Time 1651   PT Time Calculation (min) 36 min   Activity Tolerance Patient tolerated treatment well      Past Medical History  Diagnosis Date  . Hyperlipidemia     Past Surgical History  Procedure Laterality Date  . Abdominal hysterectomy    . Bunionectomy      There were no vitals filed for this visit.  Visit Diagnosis:  Left knee pain - Plan: PT plan of care cert/re-cert  Abnormal posture - Plan: PT plan of care cert/re-cert  Impaired mobility and endurance - Plan: PT plan of care cert/re-cert  Pain, elbow, left - Plan: PT plan of care cert/re-cert      Subjective Assessment - 10/19/15 1619    Subjective Pt reports that she has intermittent pain in the Lt elbow for the past 5 months with no known injury. Pain   Currently in Pain? No/denies   Multiple Pain Sites Yes   Pain Score 0   Pain Location Elbow   Pain Orientation Left   Pain Descriptors / Indicators Nagging   Pain Radiating Towards forearm   Pain Onset More than a month ago   Pain Frequency Intermittent   Aggravating Factors  push ups and carrying bags    Pain Relieving Factors massaging area; biofreeze; lying with arm straight             [facility] PT Assessment - 10/19/15 0001    Assessment   Medical Diagnosis Lt lateral epicondylitis   Referring Provider Dr. [name]    Onset Date/Surgical Date 06/17/15   Next MD Visit 10/31/15   Sensation   Additional Comments WFL's    Posture/Postural Control   Posture  Comments head forward; shoulders rounded and elevated; head of the humerus anterior in orientation    AROM   AROM Assessment Site --  bilat UE's WNLs;    Strength   Strength Assessment Site --  bilat UE's 5/5 throughout    Right Hand Grip (lbs) 57   Right Hand Lateral Pinch 6 lbs   Right Hand 3 Point Pinch 5.5 lbs   Left Hand Grip (lbs) 54   Left Hand Lateral Pinch 5 lbs   Left Hand 3 Point Pinch 5 lbs   Palpation   Palpation comment tenderness and tightness Lt lateral epicondyle and common extensor tendon                      [facility] Adult PT Treatment/Exercise - 10/19/15 0001    Therapeutic Activites    Therapeutic Activities --  transferse friction massage Lt lateral epicondyle extensors    Exercises   Exercises --  forearm extensor stretch 30 sec x3   Shoulder Exercises: Prone   Other Prone Exercises prone posterior shoulder girdle hands clasp behind back; T and goal post upper trunk extension 10 sec hold 10 reps each                 PT Education - 10/19/15  1653    Education provided Yes   Education Details lateral epicondylitis treatment; strenching; strengthening posterior shoulder girdle    Person(s) Educated Patient   Methods Explanation;Demonstration;Tactile cues;Verbal cues;Handout   Comprehension Verbalized understanding;Returned demonstration;Verbal cues required;Tactile cues required          PT Short Term Goals - 10/19/15 1705    PT SHORT TERM GOAL #1   Title Instruct patient in stretching/transverse frictioin massage for Lt lateral epicondyle 10/19/15   Time 1   Period Days   Status Achieved   PT SHORT TERM GOAL #2   Title Add treatment as needed for lateral epicondylitis 11/06/15   Time 3   Period Weeks   Status New           PT Long Term Goals - 10/19/15 1706    PT LONG TERM GOAL #1   Title Pt stands without hyperextending knees 10/23/15   Baseline improving    Time 4   Period Weeks   Status On-going   PT LONG TERM  GOAL #2   Title Pt demo good contraction of medial quad 10/23/15   Time 4   Period Weeks   Status On-going   PT LONG TERM GOAL #3   Title Pt reports decreaesd pain with standing after sitting for 45-30 min 11/06/15   Time 6   Period Weeks   Status On-going   PT LONG TERM GOAL #4   Title Pt I in HEP for gym and home at d/c 11/06/15   Period Weeks   Status On-going   PT LONG TERM GOAL #5   Title Patient improves on FOTO to</=32% limitation 11/06/15   Time 6   Period Weeks   Status On-going               Plan - 10/19/15 1700    Clinical Impression Statement Patient presents with c/o of intermittent Lt elbow pain and tightness. She has tenderness to palpation at the Lt lateral epicondyle and common extensor tendon/muscle belly of Lt forearm. Pt was instructed in appropriate stretching and strengthening program and education re- activities that may irritate symptoms as well as suggestions for modifications of activities. She will call to schedule appts as needed. She feels confident in continuing with I HEP for the elbow and the Lt knee as well. Symptoms in the Lt knee have improved significantly. Progressing  with stated goals of treatment. Will continue as pt reschedules.    Pt will benefit from skilled therapeutic intervention in order to improve on the following deficits Pain;Increased fascial restricitons;Decreased activity tolerance;Decreased endurance   Rehab Potential Good   PT Frequency 2x / week   PT Duration 6 weeks   PT Treatment/Interventions Patient/family education;ADLs/Self Care Home Management;Therapeutic activities;Therapeutic exercise;Taping;Cryotherapy;Electrical Stimulation;Moist Heat;Ultrasound;Neuromuscular re-education;Manual techniques   PT Next Visit Plan Assess response to HEP for lateral epicondylitis and response to exercises and taping to the Lt knee for improved tracking of patella.  Continue progressive medial quad/ hip strengthening and lateral thigh  stretching.    PT Home Exercise Plan HEP transverse friction massage Lt lateral epicondyle/extensor forearm to continue HEP for Lt knee.    Consulted and Agree with Plan of Care Patient        Problem List Patient Active Problem List   Diagnosis Date Noted  . Left tennis elbow 10/03/2015  . Skin lesion of right leg 08/02/2015  . Annual physical exam 06/27/2015  . Chondromalacia of left patellofemoral joint 06/27/2015  . Primary osteoarthritis of left hip  06/27/2015  . Right shoulder pain 06/27/2015  . Abnormal weight gain 06/27/2015  . Trace mitral regurgitation by prior echocardiogram 02/25/2013  . Tobacco use disorder 02/25/2013  . S/P hysterectomy 02/25/2013  . Fibrocystic breast disease 02/25/2013  . Eczema 02/25/2013  . Skin cancer, basal cell 02/25/2013  . Sinus bradycardia by electrocardiogram 02/25/2013    Shloime Keilman Nilda Simmer PT, MPH 10/19/2015, 6:14 PM  [facility] [place] [facility] [place], [place], [address] Phone: [phone]   Fax:  [phone]  Name: Addilee Neu MRN: [id number] Date of Birth: 05/30/1956    PHYSICAL THERAPY DISCHARGE SUMMARY  Visits from Start of Care: 3  Current functional level related to goals / functional outcomes: Good improvement with PT; pleased with progress and I in HEP   Remaining deficits: Mild symptoms on an intermittent basis   Education / Equipment: HEP  Plan: Patient agrees to discharge.  Patient goals were not met. Patient is being discharged due to being pleased with the current functional level.  ?????    Nour Scalise P. Helene Kelp PT, MPH 11/08/2015 12:18 PM

## 2015-10-19 NOTE — Patient Instructions (Signed)
Wrist Extensors    Elbow straight, palm down. Making a fist around your thumb. Slowly bend wrist down and toward the little finger side, until stretch is felt on top of forearm. Hold _30__ seconds. Repeat _3__ times per session. Do sessions as needed per day. Before activities requiring gripping or prolonged use of wrists and fingers.   Shoulder Blade Squeeze: Fingers Interlaced    Fingers interlaced behind your body, palms facing each other. Press pelvis down. Squeeze backbone with shoulder blades. Raise front of shoulders, chest, and head. Keep neck neutral. Hold __10-20_ seconds. Relax. Repeat _10__ times.   Shoulder Blade Squeeze: Airplane    Arms out to sides at 90, elbows straight, palms down. Press pelvis down. Squeeze backbone with shoulder blades. Raise arms, front of shoulders, chest, and head. Keep neck neutral. Hold _10-20__ seconds. Relax. Repeat _10__ times.   Shoulder Blade Squeeze: W    Arms out to sides at 90 palms down. Bend elbows to 90. Press pelvis down. Squeeze backbone with shoulder blades. Raise arms, front of shoulders, chest, and head. Keep neck neutral. Hold 10-20___ seconds. Relax. Repeat __10_ times.   Transverse friction massage to the tight area at elbow  1-2 min of deep pressure followed by ice pack for 10-15 min

## 2015-10-31 ENCOUNTER — Ambulatory Visit: Payer: 59 | Admitting: Sports Medicine

## 2016-02-12 ENCOUNTER — Other Ambulatory Visit: Payer: Self-pay | Admitting: Sports Medicine

## 2016-02-12 DIAGNOSIS — Z1231 Encounter for screening mammogram for malignant neoplasm of breast: Secondary | ICD-10-CM

## 2016-02-21 ENCOUNTER — Ambulatory Visit (INDEPENDENT_AMBULATORY_CARE_PROVIDER_SITE_OTHER): Payer: 59

## 2016-02-21 DIAGNOSIS — Z1231 Encounter for screening mammogram for malignant neoplasm of breast: Secondary | ICD-10-CM

## 2016-04-18 ENCOUNTER — Encounter: Payer: Self-pay | Admitting: Sports Medicine

## 2016-04-18 ENCOUNTER — Ambulatory Visit (INDEPENDENT_AMBULATORY_CARE_PROVIDER_SITE_OTHER): Payer: 59 | Admitting: Sports Medicine

## 2016-04-18 ENCOUNTER — Ambulatory Visit (INDEPENDENT_AMBULATORY_CARE_PROVIDER_SITE_OTHER): Payer: 59

## 2016-04-18 VITALS — BP 118/72 | HR 69 | Resp 18 | Wt 130.6 lb

## 2016-04-18 DIAGNOSIS — M5416 Radiculopathy, lumbar region: Secondary | ICD-10-CM | POA: Diagnosis not present

## 2016-04-18 DIAGNOSIS — M48061 Spinal stenosis, lumbar region without neurogenic claudication: Secondary | ICD-10-CM | POA: Insufficient documentation

## 2016-04-18 DIAGNOSIS — M4316 Spondylolisthesis, lumbar region: Secondary | ICD-10-CM | POA: Diagnosis not present

## 2016-04-18 MED ORDER — PREDNISONE 50 MG PO TABS: ORAL_TABLET | ORAL | Status: DC

## 2016-04-18 NOTE — Assessment & Plan Note (Signed)
We will start conservatively with meloxicam, prednisone, formal physical therapy and x-rays. Return to see me in one month, MRI for interventional planning if no better.

## 2016-04-18 NOTE — Progress Notes (Signed)
  Subjective:    CC: Back and leg pain  HPI: This is a very pleasant 60 year old female, for sometime she's had on and off back pain and leg pain, back pain is worse with going from sitting to standing, with radiation down the lateral aspect of the left leg to just above the foot area moderate, persistent, worse with Valsalva, no trauma, no constitutional symptoms, no bowel or bladder dysfunction or saddle numbness.  Past medical history, Surgical history, Family history not pertinant except as noted below, Social history, Allergies, and medications have been entered into the medical record, reviewed, and no changes needed.   Review of Systems: No fevers, chills, night sweats, weight loss, chest pain, or shortness of breath.   Objective:    General: Well Developed, well nourished, and in no acute distress.  Neuro: Alert and oriented x3, extra-ocular muscles intact, sensation grossly intact.  HEENT: Normocephalic, atraumatic, pupils equal round reactive to light, neck supple, no masses, no lymphadenopathy, thyroid nonpalpable.  Skin: Warm and dry, no rashes. Cardiac: Regular rate and rhythm, no murmurs rubs or gallops, no lower extremity edema.  Respiratory: Clear to auscultation bilaterally. Not using accessory muscles, speaking in full sentences. Back Exam:  Inspection: Unremarkable  Motion: Flexion 45 deg, Extension 45 deg, Side Bending to 45 deg bilaterally,  Rotation to 45 deg bilaterally  SLR laying: Negative  XSLR laying: Negative  Palpable tenderness: None. FABER: negative. Sensory change: Gross sensation intact to all lumbar and sacral dermatomes.  Reflexes: 2+ at both patellar tendons, 2+ at achilles tendons, Babinski's downgoing.  Strength at foot  Plantar-flexion: 5/5 Dorsi-flexion: 5/5 Eversion: 5/5 Inversion: 5/5  Leg strength  Quad: 5/5 Hamstring: 5/5 Hip flexor: 5/5 Hip abductors: 5/5  Gait unremarkable.  Impression and Recommendations:

## 2016-05-02 ENCOUNTER — Ambulatory Visit (INDEPENDENT_AMBULATORY_CARE_PROVIDER_SITE_OTHER): Payer: 59 | Admitting: Physical Therapy

## 2016-05-02 ENCOUNTER — Encounter: Payer: Self-pay | Admitting: Physical Therapy

## 2016-05-02 DIAGNOSIS — R293 Abnormal posture: Secondary | ICD-10-CM | POA: Diagnosis not present

## 2016-05-02 DIAGNOSIS — M5442 Lumbago with sciatica, left side: Secondary | ICD-10-CM

## 2016-05-02 NOTE — Therapy (Signed)
[place], [place], [address] Phone: [phone]   Fax:  [phone]  Physical Therapy Evaluation  Patient Details  Name: Linda Lam MRN: [id number] Date of Birth: [birth year] Referring Provider: Dr [name]  Encounter Date: 05/02/2016      PT End of Session - 05/02/16 1710    Visit Number 1   Number of Visits 4   Date for PT Re-Evaluation 05/30/16   PT Start Time 1620   PT Stop Time 1705   PT Time Calculation (min) 45 min   Activity Tolerance Patient tolerated treatment well      Past Medical History  Diagnosis Date  . Hyperlipidemia     Past Surgical History  Procedure Laterality Date  . Abdominal hysterectomy    . Bunionectomy      There were no vitals filed for this visit.       Subjective Assessment - 05/02/16 1624    Subjective Pt reports she is having issues with her sciatic nerve in March. She though she had pulled a muscle and it didin't go away. She has been pushing throughthe pain. Unable to do a push ups. Participates in yoga and this seems to hlep. Will bother her in bed when she moves or tries to stretch   How long can you sit comfortably? biggest problem,  tolerates ~ 30 min-1hr   How long can you stand comfortably? no limit   How long can you walk comfortably? no limit , will losen it up.    Diagnostic tests x-rays DDD L4-5   Patient Stated Goals get rid of pain not have to push through it to do  exercise.    Currently in Pain? No/denies   Pain Location Back   Pain Orientation Left   Pain Descriptors / Indicators Stabbing   Pain Type Acute pain   Pain Radiating Towards to lower Lt shin   Pain Onset More than a month ago   Pain Frequency Intermittent   Aggravating Factors  push ups, leg extension with resistance   Pain Relieving Factors stopping the aggrivating activity.             [facility] PT Assessment - 05/02/16 0001    Assessment   Medical  Diagnosis Lt lumbar  radiculopahty   Referring Provider Dr [name]   Onset Date/Surgical Date 01/31/16   Next MD Visit 05/30/16   Prior Therapy not for back   Precautions   Precautions None   Balance Screen   Has the patient fallen in the past 6 months Yes   How many times? 1  slipped and landed on her buttocks in March, before the pain   Has the patient had a decrease in activity level because of a fear of falling?  No   Is the patient reluctant to leave their home because of a fear of falling?  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    ROM / Strength   AROM / PROM / Strength AROM;Strength   AROM   AROM Assessment Site Lumbar   Lumbar Flexion WNL   Lumbar Extension WNL, painful arc   Lumbar - Right Side Bend WNL   Lumbar - Left Side Bend decreased 25% with pain   Lumbar - Right Rotation decreased 50% with Lt side tenderness   Lumbar - Left Rotation WNL   Strength   Strength Assessment Site Lumbar;Hip;Knee;Ankle   Right/Left Knee --  bilat WNl   Right/Left Ankle --  bilat WNL   Lumbar Flexion --  good   Lumbar Extension --  able to contract multifidi however not isolate them.    Palpation   Palpation comment very tight in Lt piriformis, gluts and QL/                    [facility] Adult PT Treatment/Exercise - 05/02/16 0001    Exercises   Exercises Lumbar   Lumbar Exercises: Stretches   ITB Stretch 2 reps;30 seconds   Piriformis Stretch 2 reps;30 seconds   Lumbar Exercises: Prone   Other Prone Lumbar Exercises pelvic press x 3 reps with tactile cues; repeated with bilat knee flex/ext x 5 reps    Modalities   Modalities Ultrasound;Buyer, retail Lt piriformis/ Lt Web designer Parameters to tolerance    Electrical Stimulation Goals Pain  tightness   Ultrasound   Ultrasound Location Lt piriformis/ Lt QL   Ultrasound Parameters 100%, 1.2 w/cm2, 8 min    Ultrasound Goals Pain   Manual Therapy   Manual Therapy Muscle Energy Technique   Muscle Energy Technique to rotate Lt ilium FWD.                 PT Education - 05/02/16 1711    Education provided Yes   Education Details HEP (handout given); rationale of combo [place].    Person(s) Educated Patient   Methods Explanation   Comprehension Verbalized understanding;Returned demonstration             PT Long Term Goals - 05/02/16 1800    PT LONG TERM GOAL #1   Title I with advanced HEP for core ( 05/30/16) n   Time 4   Period Weeks   Status New   PT LONG TERM GOAL #2   Title demo full lumbar ROM without pain( 05/30/16)    Time 4   Period Weeks   Status New   PT LONG TERM GOAL #3   Title reports overall pain reduction to allow her to sit/stand per her previous level ( 05/30/16)    Time 4   Period Weeks   Status New   PT LONG TERM GOAL #4   Title reduce FOTO =/< 21% limited ( 05/30/16)    Time 4   Period Weeks   Status New               Plan - 05/02/16 1758    Clinical Impression Statement Pt presented out of alignment in her pelvis.  This was corrected with muscle energy technique.  She continues to have spasm in her Lt buttocks and  low back due to being  out of alignment. She is limited in attendance due to cost of attending so she won't be able to come that often.    Rehab Potential Good   PT Frequency 1x / week   PT Duration 4 weeks   PT Treatment/Interventions Manual techniques;Therapeutic exercise;Moist Heat;Electrical Stimulation;Dry needling;Cryotherapy;Patient/family education;Neuromuscular re-education;Traction   PT Next Visit Plan check alignment, progress core stab, TDN if indicated   Consulted and Agree with Plan of Care Patient      Patient will benefit from skilled therapeutic intervention in order to improve the following deficits and impairments:  Postural dysfunction, Pain, Increased muscle spasms  Visit Diagnosis: Left-sided low back pain with left-sided sciatica - Plan: PT plan of care cert/re-cert  Abnormal posture - Plan: PT plan of care cert/re-cert     Problem List Patient Active Problem List   Diagnosis Date Noted  . Left lumbar radiculopathy 04/18/2016  . Left tennis elbow 10/03/2015  . Skin lesion of right leg 08/02/2015  . Annual physical exam 06/27/2015  . Chondromalacia of left patellofemoral joint 06/27/2015  . Primary osteoarthritis of left hip 06/27/2015  . Right shoulder pain 06/27/2015  . Abnormal weight gain 06/27/2015  . Trace mitral regurgitation by prior echocardiogram 02/25/2013  . Tobacco use disorder 02/25/2013  . S/P hysterectomy 02/25/2013  . Fibrocystic breast disease 02/25/2013  . Eczema 02/25/2013  . Skin cancer, basal cell 02/25/2013  . Sinus bradycardia by electrocardiogram 02/25/2013    Jeral Pinch PT  05/02/2016, 6:04 PM  [facility] [place], [place], [address] Phone: [phone]   Fax:  [phone]  Name: Linda Lam MRN: [id number] Date of Birth: 09-01-1956

## 2016-05-02 NOTE — Patient Instructions (Signed)
Piriformis Stretch, Supine    Lie supine, folded towel under sacrum, one ankle crossed onto opposite knee. Holding bottom leg behind knee, gently pull legs toward chest and roll toward top-leg side. Feel stretch in hip or pelvic region. Hold _30__ seconds.  Repeat _2-3__ times per session. Do __2_ sessions per day. Outer Hip Stretch: Reclined IT Band Stretch (Strap)    Strap around opposite foot, pull across only as far as possible with shoulders on mat. Hold for __30__ seconds. Repeat __2__ times each leg.  Pelvic Press     Place hands under belly between navel and pubic bone, palms up. Feel pressure on hands. Increase pressure on hands by pressing pelvis down. This is NOT a pelvic tilt. Hold __5_ seconds. Relax. Repeat _10__ times. Once a day.  KNEE: Flexion - Prone   Hold pelvic press. Bend knee, then return the foot down. Repeat on opposite leg. Do not raise hips. _10__ reps per set. When this is mastered, pull both heels up at same time, x 10 reps.  Once a day    [facility] [place], [place] [address]  [phone] (office) [phone] (fax)

## 2016-05-08 ENCOUNTER — Ambulatory Visit (INDEPENDENT_AMBULATORY_CARE_PROVIDER_SITE_OTHER): Payer: 59 | Admitting: Physical Therapy

## 2016-05-08 ENCOUNTER — Encounter: Payer: Self-pay | Admitting: Physical Therapy

## 2016-05-08 DIAGNOSIS — M5442 Lumbago with sciatica, left side: Secondary | ICD-10-CM

## 2016-05-08 DIAGNOSIS — Z7409 Other reduced mobility: Secondary | ICD-10-CM

## 2016-05-08 DIAGNOSIS — M25562 Pain in left knee: Secondary | ICD-10-CM

## 2016-05-08 DIAGNOSIS — R293 Abnormal posture: Secondary | ICD-10-CM

## 2016-05-08 NOTE — Patient Instructions (Signed)

## 2016-05-08 NOTE — Therapy (Signed)
[place], [place], [address] Phone: [phone]   Fax:  [phone]  Physical Therapy Treatment  Patient Details  Name: Linda Lam MRN: [id number] Date of Birth: 07-12-1956 Referring Provider: Dr [name]  Encounter Date: 05/08/2016      PT End of Session - 05/08/16 1628    Visit Number 2   Number of Visits 4   Date for PT Re-Evaluation 05/30/16   PT Start Time 1603   PT Stop Time 1700   PT Time Calculation (min) 57 min   Activity Tolerance Patient tolerated treatment well      Past Medical History  Diagnosis Date  . Hyperlipidemia     Past Surgical History  Procedure Laterality Date  . Abdominal hysterectomy    . Bunionectomy      There were no vitals filed for this visit.      Subjective Assessment - 05/08/16 1604    Subjective Pt was at the gym Thursday night in a class, she was doing a lateral shuffle and felt a twinge in her Lt knee under the knee cap.  She will see the MD tomorrow for this.     Currently in Pain? Yes   Pain Score --  8/10 when it hits, with turning supine to sidelying    Pain Location Back   Pain Orientation Left   Pain Descriptors / Indicators Stabbing;Sharp   Pain Type Acute pain   Pain Frequency Intermittent   Aggravating Factors  transitiioning in bed   Pain Relieving Factors exercise                          [facility] Adult PT Treatment/Exercise - 05/08/16 0001    Lumbar Exercises: Stretches   Single Knee to Chest Stretch 20 seconds   Double Knee to Chest Stretch 30 seconds   ITB Stretch 2 reps;30 seconds  cross body with strap   Piriformis Stretch 1 rep;30 seconds   Lumbar Exercises: Supine   Isometric Hip Flexion 10 reps;5 seconds   Isometric Hip Flexion Limitations contralateral & ipsolateral arms    Modalities   Modalities Electrical Stimulation;Moist Heat   Moist Heat Therapy   Number Minutes Moist Heat 15  Minutes   Moist Heat Location Lumbar Spine  buttocks   Electrical Stimulation   Electrical Stimulation Location Lt low back/buttocks   Electrical Stimulation Action IFC   Electrical Stimulation Parameters to tolerance   Electrical Stimulation Goals Pain;Tone   Manual Therapy   Manual Therapy Soft tissue mobilization   Soft tissue mobilization Lt gluts, piriformis and low back musculature.   Lt QL          Trigger Point Dry Needling - 05/08/16 1623    Consent Given? Yes   Education Handout Provided Yes   Muscles Treated Upper Body Quadratus Lumborum   Muscles Treated Lower Body Piriformis;Gluteus maximus  glut med Lt   Gluteus Maximus Response Twitch response elicited;Palpable increased muscle length   Piriformis Response Twitch response elicited;Palpable increased muscle length              PT Education - 05/08/16 1623    Education provided Yes   Education Details TDN   Person(s) Educated Patient   Methods Explanation;Handout   Comprehension Verbalized understanding             PT Long Term Goals - 05/08/16 1627    PT LONG TERM GOAL #1  Title I with advanced HEP for core ( 05/30/16)    Time 4   Period Weeks   Status On-going   PT LONG TERM GOAL #2   Title demo full lumbar ROM without pain( 05/30/16)    Time 4   Period Weeks   Status On-going   PT LONG TERM GOAL #3   Title reports overall pain reduction to allow her to sit/stand per her previous level ( 05/30/16)    Time 4   Period Weeks   Status On-going   PT LONG TERM GOAL #4   Title reduce FOTO =/< 21% limited ( 05/30/16)    Time 4   Period Weeks   Status On-going               Plan - 05/08/16 1649    Clinical Impression Statement this is her second visit, had good twitch responses with TDN, the largest was in the Lt QL.  She demo'd increased side bend after treatment.  No goals met as of this visit.  She did stay in alignment over the week.    Rehab Potential Good   PT Frequency 1x /  week   PT Duration 4 weeks   PT Treatment/Interventions Manual techniques;Therapeutic exercise;Moist Heat;Electrical Stimulation;Dry needling;Cryotherapy;Patient/family education;Neuromuscular re-education;Traction   PT Next Visit Plan check alignment, progress core stab, TDN if indicated   Consulted and Agree with Plan of Care Patient      Patient will benefit from skilled therapeutic intervention in order to improve the following deficits and impairments:  Postural dysfunction, Pain, Increased muscle spasms  Visit Diagnosis: Left-sided low back pain with left-sided sciatica  Abnormal posture  Left knee pain  Impaired mobility and endurance     Problem List Patient Active Problem List   Diagnosis Date Noted  . Left lumbar radiculopathy 04/18/2016  . Left tennis elbow 10/03/2015  . Skin lesion of right leg 08/02/2015  . Annual physical exam 06/27/2015  . Chondromalacia of left patellofemoral joint 06/27/2015  . Primary osteoarthritis of left hip 06/27/2015  . Right shoulder pain 06/27/2015  . Abnormal weight gain 06/27/2015  . Trace mitral regurgitation by prior echocardiogram 02/25/2013  . Tobacco use disorder 02/25/2013  . S/P hysterectomy 02/25/2013  . Fibrocystic breast disease 02/25/2013  . Eczema 02/25/2013  . Skin cancer, basal cell 02/25/2013  . Sinus bradycardia by electrocardiogram 02/25/2013    Jeral Pinch PT  05/08/2016, 4:50 PM  [facility] [place], [place], [address] Phone: [phone]   Fax:  [phone]  Name: Linda Lam MRN: [id number] Date of Birth: 1956-07-20

## 2016-05-09 ENCOUNTER — Ambulatory Visit (INDEPENDENT_AMBULATORY_CARE_PROVIDER_SITE_OTHER): Payer: 59 | Admitting: Sports Medicine

## 2016-05-09 ENCOUNTER — Encounter: Payer: Self-pay | Admitting: Sports Medicine

## 2016-05-09 VITALS — BP 112/69 | HR 76 | Resp 16 | Wt 129.9 lb

## 2016-05-09 DIAGNOSIS — M5416 Radiculopathy, lumbar region: Secondary | ICD-10-CM | POA: Diagnosis not present

## 2016-05-09 DIAGNOSIS — M2242 Chondromalacia patellae, left knee: Secondary | ICD-10-CM

## 2016-05-09 DIAGNOSIS — C4491 Basal cell carcinoma of skin, unspecified: Secondary | ICD-10-CM | POA: Diagnosis not present

## 2016-05-09 NOTE — Assessment & Plan Note (Signed)
Will continue with PT, and icing, has restarted meloxicam and will do this for 2 weeks before considering injection.

## 2016-05-09 NOTE — Assessment & Plan Note (Signed)
Persistent left sided radiculopathy, wants to continue PT with dry needling. If failure we will proceed with MRI, she does have 3.5 mm of spondylolisthesis.

## 2016-05-09 NOTE — Progress Notes (Signed)
  Subjective:    CC: Left knee pain  HPI: Primary osteoarthritis of left knee: Predominantly patellofemoral, recently was doing some aerobics and felt a pop in her left knee, she had pain that she localizes along the medial joint line with occasional persistent popping but no catching, locking, buckling. She has restarted her meloxicam, overall doing well.  Lumbar radiculopathy: Improved with physical therapy, not yet ready to consider MRI for interventional planning.  Past medical history, Surgical history, Family history not pertinant except as noted below, Social history, Allergies, and medications have been entered into the medical record, reviewed, and no changes needed.   Review of Systems: No fevers, chills, night sweats, weight loss, chest pain, or shortness of breath.   Objective:    General: Well Developed, well nourished, and in no acute distress.  Neuro: Alert and oriented x3, extra-ocular muscles intact, sensation grossly intact.  HEENT: Normocephalic, atraumatic, pupils equal round reactive to light, neck supple, no masses, no lymphadenopathy, thyroid nonpalpable.  Skin: Warm and dry, no rashes. Cardiac: Regular rate and rhythm, no murmurs rubs or gallops, no lower extremity edema.  Respiratory: Clear to auscultation bilaterally. Not using accessory muscles, speaking in full sentences. Left Knee: Normal to inspection with no erythema or effusion or obvious bony abnormalities. Tender to palpation at the medial joint line ROM normal in flexion and extension and lower leg rotation. Ligaments with solid consistent endpoints including ACL, PCL, LCL, MCL. Negative Mcmurray's and provocative meniscal tests. Non painful patellar compression. Patellar and quadriceps tendons unremarkable. Hamstring and quadriceps strength is normal.  Impression and Recommendations:    I spent 25 minutes with this patient, greater than 50% was face-to-face time counseling regarding the above  diagnoses

## 2016-05-09 NOTE — Assessment & Plan Note (Signed)
Once a referral to a new dermatologist, she will do some research and let me know.

## 2016-05-14 ENCOUNTER — Encounter: Payer: Self-pay | Admitting: Physical Therapy

## 2016-05-14 ENCOUNTER — Ambulatory Visit (INDEPENDENT_AMBULATORY_CARE_PROVIDER_SITE_OTHER): Payer: 59 | Admitting: Physical Therapy

## 2016-05-14 DIAGNOSIS — R293 Abnormal posture: Secondary | ICD-10-CM

## 2016-05-14 DIAGNOSIS — M5442 Lumbago with sciatica, left side: Secondary | ICD-10-CM

## 2016-05-14 NOTE — Therapy (Signed)
[place]inda Lam, [place], [address] Phone: [phone]   Fax:  [phone]  Physical Therapy Treatment  Patient Details  Name: Linda Lam MRN: [id number] Date of Birth: Jun 27, 1956 Referring Provider: Dr [name]  Encounter Date: 05/14/2016      PT End of Session - 05/14/16 0935    Visit Number 3   Number of Visits 4   Date for PT Re-Evaluation 05/30/16   PT Start Time 0936   PT Stop Time 1032   PT Time Calculation (min) 56 min   Activity Tolerance Patient tolerated treatment well      Past Medical History  Diagnosis Date  . Hyperlipidemia     Past Surgical History  Procedure Laterality Date  . Abdominal hysterectomy    . Bunionectomy      There were no vitals filed for this visit.      Subjective Assessment - 05/14/16 0938    Subjective Pt reporst taht after the needling she felt pretty good for a couple of days, yesterday was terrible. Did go to the gym yesterday, going down to do a push up hurt, lots of burpees.    Currently in Pain? Yes   Pain Score 3   yesterday 9/10   Pain Location Buttocks   Pain Orientation Left   Pain Type Acute pain   Pain Onset More than a month ago   Pain Frequency Intermittent   Aggravating Factors  transitition in bed   Pain Relieving Factors exercise                          [facility] Adult PT Treatment/Exercise - 05/14/16 0001    Self-Care   Self-Care Other Self-Care Comments   Other Self-Care Comments  recommend pt stop burpess for a couple weeks and return to squats   Lumbar Exercises: Supine   Other Supine Lumbar Exercises trigger point release to Lt gluts with ball in supine and standing against wall   Modalities   Modalities Electrical Stimulation;Moist Heat   Moist Heat Therapy   Number Minutes Moist Heat 15 Minutes   Moist Heat Location Lumbar Spine  buttocks Lt    Electrical Stimulation   Electrical Stimulation  Location Lt low back/buttocks   Electrical Stimulation Action IFC   Electrical Stimulation Parameters  to tolerance   Electrical Stimulation Goals Pain;Tone   Manual Therapy   Manual Therapy Soft tissue mobilization   Soft tissue mobilization Lt gluts, piriformis. lumbar paraspinals and QL.  QL and gluts with less trigger points than last week.  Mulitifidi very tight.           Trigger Point Dry Needling - 05/14/16 0941    Consent Given? Yes   Education Handout Provided No   Muscles Treated Upper Body Longissimus;Gluteus maximus;Piriformis  glut med Lt   Longissimus Response Palpable increased muscle length;Twitch response elicited  used e-stim with this L5-2   Gluteus Maximus Response Palpable increased muscle length;Twitch response elicited   Piriformis Response Palpable increased muscle length;Twitch response elicited  very tender today                   PT Long Term Goals - 05/14/16 1000    PT LONG TERM GOAL #1   Title I with advanced HEP for core ( 05/30/16)    Status Achieved   PT LONG TERM GOAL #2   Title demo full lumbar ROM without pain( 05/30/16)  Status On-going   PT LONG TERM GOAL #3   Title reports overall pain reduction to allow Linda Lam to sit/stand per Linda Lam previous level ( 05/30/16)    Status On-going   PT LONG TERM GOAL #4   Title reduce FOTO =/< 21% limited ( 05/30/16)    Status On-going               Plan - 05/14/16 1001    Clinical Impression Statement Linda Lam had relief for a couple days after last session, then the pain returned.  She continues to have tightness in the Lt side low back and buttocks, however less than last week.  She has met goal #1 and making slow progress to the others.    Rehab Potential Good   PT Frequency 1x / week   PT Duration 4 weeks   PT Treatment/Interventions Manual techniques;Therapeutic exercise;Moist Heat;Electrical Stimulation;Dry needling;Cryotherapy;Patient/family education;Neuromuscular re-education;Traction    PT Next Visit Plan check alignment, progress core stab, TDN if indicated   Consulted and Agree with Plan of Care Patient      Patient will benefit from skilled therapeutic intervention in order to improve the following deficits and impairments:  Postural dysfunction, Pain, Increased muscle spasms  Visit Diagnosis: Left-sided low back pain with left-sided sciatica  Abnormal posture     Problem List Patient Active Problem List   Diagnosis Date Noted  . Left lumbar radiculopathy 04/18/2016  . Left tennis elbow 10/03/2015  . Skin lesion of right leg 08/02/2015  . Annual physical exam 06/27/2015  . Chondromalacia of left patellofemoral joint 06/27/2015  . Primary osteoarthritis of left hip 06/27/2015  . Right shoulder pain 06/27/2015  . Abnormal weight gain 06/27/2015  . Trace mitral regurgitation by prior echocardiogram 02/25/2013  . Tobacco use disorder 02/25/2013  . S/P hysterectomy 02/25/2013  . Fibrocystic breast disease 02/25/2013  . Eczema 02/25/2013  . Skin cancer, basal cell 02/25/2013  . Sinus bradycardia by electrocardiogram 02/25/2013    Jeral Pinch PT 05/14/2016, 10:27 AM  [facility] [place], [place], [address] Phone: [phone]   Fax:  [phone]  Name: Linda Lam MRN: [id number] Date of Birth: 1955/12/22

## 2016-05-21 ENCOUNTER — Telehealth: Payer: Self-pay | Admitting: Sports Medicine

## 2016-05-21 ENCOUNTER — Encounter: Payer: Self-pay | Admitting: Physical Therapy

## 2016-05-21 ENCOUNTER — Ambulatory Visit (INDEPENDENT_AMBULATORY_CARE_PROVIDER_SITE_OTHER): Payer: 59 | Admitting: Physical Therapy

## 2016-05-21 DIAGNOSIS — M5442 Lumbago with sciatica, left side: Secondary | ICD-10-CM | POA: Diagnosis not present

## 2016-05-21 DIAGNOSIS — M5416 Radiculopathy, lumbar region: Secondary | ICD-10-CM

## 2016-05-21 DIAGNOSIS — M2242 Chondromalacia patellae, left knee: Secondary | ICD-10-CM

## 2016-05-21 DIAGNOSIS — R293 Abnormal posture: Secondary | ICD-10-CM

## 2016-05-21 NOTE — Patient Instructions (Signed)
Abdominal Bracing With Pelvic Floor (Hook-Lying) - do not flatten your spine   With neutral spine, tighten pelvic floor and abdominals. Hold 5 seconds. Repeat __10_ times. Do _1__ times a day.  Knee to Chest: Transverse Plane Stability - small lift, if you develop symptoms into your leg stop and don't lift as high.    Bring one knee up, then return. Be sure pelvis does not roll side to side. Keep pelvis still. Lift knee __10_ times each leg. Restabilize pelvis. Repeat with other leg. Do _1-2__ sets, _1__ times per day.  Hip External Rotation With Pillow: Transverse Plane Stability   One knee bent, one leg straight, on pillow. Slowly roll bent knee out. Be sure pelvis does not rotate. Do _10__ times. Restabilize pelvis. Repeat with other leg. Do _1-2__ sets, _1__ times per day.  On Elbows (Prone)    Rise up on elbows as high as possible, keeping hips on floor. Hold _30-60___ seconds. Repeat __1__ times per set. Do __1__ sets per session. Do _2___ sessions per day.  Back Hyperextension: Using Arms    Lying face down with arms bent, inhale. Then while exhaling, straighten arms. Hold __3-5__ seconds. Slowly return to starting position. Repeat _10___ times per set. Do _1_ sets per session. Do __2__ sessions per day.  [facility] [place], [place] [address]  [phone] (office) [phone] (fax)

## 2016-05-21 NOTE — Telephone Encounter (Signed)
Pt advised Imaging will contact her to schedule. Imaging notified.

## 2016-05-21 NOTE — Telephone Encounter (Signed)
MRI ordered for both the lumbar spine and the knee.

## 2016-05-21 NOTE — Telephone Encounter (Signed)
Pt called to state her back pain is not any better after PT. Pt ready to set up MRI. Will route to PCP to review and order.   Pt states her knee is bothering her as well. Pt denied to get an knee injection. Pt states there is no pain just clicking. Will route.   Pt states her insurance expires 05/31/16.

## 2016-05-21 NOTE — Therapy (Addendum)
[place], [place], [address] Phone: [phone]   Fax:  [phone]  Physical Therapy Treatment  Patient Details  Name: Linda Lam MRN: [id number] Date of Birth: June 23, 1956 Referring Provider: Dr [name]  Encounter Date: 05/21/2016      PT End of Session - 05/21/16 0854    Visit Number 4   Number of Visits 4   PT Start Time 5638   PT Stop Time 0937   PT Time Calculation (min) 43 min   Activity Tolerance Patient tolerated treatment well      Past Medical History  Diagnosis Date  . Hyperlipidemia     Past Surgical History  Procedure Laterality Date  . Abdominal hysterectomy    . Bunionectomy      There were no vitals filed for this visit.      Subjective Assessment - 05/21/16 0856    Subjective Pt reports she is having no relief even trying heat everyday. Didn't even have relief after the TDN last visit.     Currently in Pain? Yes   Pain Score 4    Pain Location Buttocks   Pain Orientation Left   Pain Descriptors / Indicators Stabbing   Pain Type Acute pain   Pain Onset More than a month ago   Pain Frequency Intermittent   Aggravating Factors  transitioning in bed, push ups   Pain Relieving Factors exercise some times.             [facility] PT Assessment - 05/21/16 0001    Assessment   Medical Diagnosis Lt lumbar  radiculopahty   Referring Provider Dr [name]   Onset Date/Surgical Date 01/31/16   Next MD Visit recommend pt follow up with MD   Observation/Other Assessments   Focus on Therapeutic Outcomes (FOTO)  44% limited   AROM   AROM Assessment Site Lumbar   Lumbar Flexion WNL no pain   Lumbar Extension WNL no pain   Lumbar - Right Side Bend WNL   Lumbar - Left Side Bend WNL   Lumbar - Right Rotation WNL   Lumbar - Left Rotation WNL, has slight increase in pain                     [facility] Adult PT Treatment/Exercise - 05/21/16 0001     Lumbar Exercises: Aerobic   UBE (Upper Arm Bike) standing L1 x 5' alt FWD/BWD   Lumbar Exercises: Supine   Ab Set 10 reps  VC for form   Clam 10 reps   Bent Knee Raise 10 reps   Lumbar Exercises: Prone   Other Prone Lumbar Exercises POE, prone press ups   Modalities   Modalities Traction   Traction   Type of Traction Lumbar  prone   Min (lbs) 30   Max (lbs) 40   Hold Time 60   Rest Time 20   Time 13                PT Education - 05/21/16 0918    Education provided Yes   Education Details HEP    Person(s) Educated Patient   Methods Explanation;Handout   Comprehension Returned demonstration;Need further instruction;Verbalized understanding             PT Long Term Goals - 05/21/16 7564    PT LONG TERM GOAL #1   Title I with advanced HEP for core ( 05/30/16)    Status Achieved   PT LONG  TERM GOAL #2   Title demo full lumbar ROM without pain( 05/30/16)    Status Achieved   PT LONG TERM GOAL #3   Title reports overall pain reduction to allow her to sit/stand per her previous level ( 05/30/16)    Status Not Met   PT LONG TERM GOAL #4   Title reduce FOTO =/< 21% limited ( 05/30/16)    Status Not Met  scored 44% limited               Plan - 05/21/16 6333    Clinical Impression Statement Ro reports she really hasn't had much improvement over the last month.  We initally discussed trying PT for a month and if not better returning to her MD.  This is the case.  We did try lumbar traction today and she did have some relief with that.  Goals are partially met.    PT Next Visit Plan will place patient on hold until she sees the MD and see what they decide regarding continue PT with lumbar traction or procede another direction.    Consulted and Agree with Plan of Care Patient      Patient will benefit from skilled therapeutic intervention in order to improve the following deficits and impairments:     Visit Diagnosis: Left-sided low back pain with  left-sided sciatica  Abnormal posture     Problem List Patient Active Problem List   Diagnosis Date Noted  . Left lumbar radiculopathy 04/18/2016  . Left tennis elbow 10/03/2015  . Skin lesion of right leg 08/02/2015  . Annual physical exam 06/27/2015  . Chondromalacia of left patellofemoral joint 06/27/2015  . Primary osteoarthritis of left hip 06/27/2015  . Right shoulder pain 06/27/2015  . Abnormal weight gain 06/27/2015  . Trace mitral regurgitation by prior echocardiogram 02/25/2013  . Tobacco use disorder 02/25/2013  . S/P hysterectomy 02/25/2013  . Fibrocystic breast disease 02/25/2013  . Eczema 02/25/2013  . Skin cancer, basal cell 02/25/2013  . Sinus bradycardia by electrocardiogram 02/25/2013    Jeral Pinch PT 05/21/2016, 9:30 AM  [facility] [place], [place], [address] Phone: [phone]   Fax:  [phone]  Name: Linda Lam MRN: [id number] Date of Birth: 14-Aug-1956    PHYSICAL THERAPY DISCHARGE SUMMARY  Visits from Start of Care: 4 Current functional level related to goals / functional outcomes: See above at last visit   Remaining deficits: Pain continued to interfere with her activity level   Education / Equipment: HEP  Plan: Patient agrees to discharge.  Patient goals were partially met. Patient is being discharged due to the patient's request. She is going to try injections ?????   Jeral Pinch, PT 06/06/2016 10:06 AM

## 2016-05-27 ENCOUNTER — Ambulatory Visit (INDEPENDENT_AMBULATORY_CARE_PROVIDER_SITE_OTHER): Payer: 59

## 2016-05-27 DIAGNOSIS — M2242 Chondromalacia patellae, left knee: Secondary | ICD-10-CM | POA: Diagnosis not present

## 2016-05-27 DIAGNOSIS — M4806 Spinal stenosis, lumbar region: Secondary | ICD-10-CM | POA: Diagnosis not present

## 2016-05-27 DIAGNOSIS — M7122 Synovial cyst of popliteal space [Baker], left knee: Secondary | ICD-10-CM

## 2016-05-27 DIAGNOSIS — M5416 Radiculopathy, lumbar region: Secondary | ICD-10-CM

## 2016-05-27 DIAGNOSIS — M4316 Spondylolisthesis, lumbar region: Secondary | ICD-10-CM | POA: Diagnosis not present

## 2016-05-30 ENCOUNTER — Ambulatory Visit (INDEPENDENT_AMBULATORY_CARE_PROVIDER_SITE_OTHER): Payer: 59 | Admitting: Sports Medicine

## 2016-05-30 ENCOUNTER — Encounter: Payer: Self-pay | Admitting: Sports Medicine

## 2016-05-30 VITALS — BP 120/71 | HR 71 | Wt 127.0 lb

## 2016-05-30 DIAGNOSIS — M1711 Unilateral primary osteoarthritis, right knee: Secondary | ICD-10-CM

## 2016-05-30 DIAGNOSIS — M5416 Radiculopathy, lumbar region: Secondary | ICD-10-CM | POA: Diagnosis not present

## 2016-05-30 MED ORDER — DIAZEPAM 5 MG PO TABS: ORAL_TABLET | ORAL | Status: DC

## 2016-05-30 NOTE — Assessment & Plan Note (Signed)
We are going to proceed with a left L4-L5 interlaminar epidural, she does have moderate to severe central canal stenosis. Adding to Valium for preprocedural anxiolysis.

## 2016-05-30 NOTE — Progress Notes (Signed)
  Subjective:    CC: Follow-up  HPI: Lumbar radiculopathy: Left-sided, S1 distribution. With axial back pain as well.  Has had physical therapy, steroids, NSAIDs without significant improvement at this point.  Knee pain: MRI showed simple osteoarthritis. Not yet ready to consider injection treatment.  Past medical history, Surgical history, Family history not pertinant except as noted below, Social history, Allergies, and medications have been entered into the medical record, reviewed, and no changes needed.   Review of Systems: No fevers, chills, night sweats, weight loss, chest pain, or shortness of breath.   Objective:    General: Well Developed, well nourished, and in no acute distress.  Neuro: Alert and oriented x3, extra-ocular muscles intact, sensation grossly intact.  HEENT: Normocephalic, atraumatic, pupils equal round reactive to light, neck supple, no masses, no lymphadenopathy, thyroid nonpalpable.  Skin: Warm and dry, no rashes. Cardiac: Regular rate and rhythm, no murmurs rubs or gallops, no lower extremity edema.  Respiratory: Clear to auscultation bilaterally. Not using accessory muscles, speaking in full sentences.  Lumbar spine MRI shows moderate to severe central canal stenosis as well as by foraminal stenosis at the L4-L5 level, there is also L4 on L5 spondylolisthesis of the proximally 2-3 mm. She also has areas of grade 4 chondromalacia under the patellofemoral joint, as well as under the tibiofemoral joint, no evidence of meniscal tearing.  Impression and Recommendations:    I spent 25 minutes with this patient, greater than 50% was face-to-face time counseling regarding the above diagnoses

## 2016-05-30 NOTE — Assessment & Plan Note (Signed)
Osteoarthritis of the knee on MRI, no meniscal tearing. She is now a candidate for viscous supplementation, she will look into the Orthovisc and call us if she would like to proceed.

## 2016-07-12 ENCOUNTER — Other Ambulatory Visit: Payer: 59

## 2016-07-12 ENCOUNTER — Ambulatory Visit
Admission: RE | Admit: 2016-07-12 | Discharge: 2016-07-12 | Disposition: A | Discharge status: Home / Self Care | Payer: 59 | Source: Ambulatory Visit | Attending: Sports Medicine | Admitting: Sports Medicine

## 2016-07-12 VITALS — BP 140/74 | HR 58

## 2016-07-12 DIAGNOSIS — M5416 Radiculopathy, lumbar region: Secondary | ICD-10-CM

## 2016-07-12 MED ORDER — IOPAMIDOL (ISOVUE-M 200) INJECTION 41%
1.0000 mL | Freq: Once | INTRAMUSCULAR | Status: AC
  Administered 2016-07-12: 1 mL via EPIDURAL

## 2016-07-12 MED ORDER — METHYLPREDNISOLONE ACETATE 40 MG/ML INJ SUSP (RADIOLOG
120.0000 mg | Freq: Once | INTRAMUSCULAR | Status: AC
  Administered 2016-07-12: 120 mg via EPIDURAL

## 2016-07-12 NOTE — Discharge Instructions (Signed)

## 2016-09-30 ENCOUNTER — Ambulatory Visit (INDEPENDENT_AMBULATORY_CARE_PROVIDER_SITE_OTHER): Payer: 59 | Admitting: Sports Medicine

## 2016-09-30 DIAGNOSIS — M5416 Radiculopathy, lumbar region: Secondary | ICD-10-CM | POA: Diagnosis not present

## 2016-09-30 NOTE — Assessment & Plan Note (Signed)
Repeat left L4-L5 interlaminar epidural, she got 2-1/2 months out of the previous injection. Also referral to Dr. Lynann Bologna, though I think she is doing well with conservative measures she does have moderate to severe central canal stenosis.

## 2016-09-30 NOTE — Progress Notes (Signed)
  Subjective:    CC: Follow-up  HPI: This is a pleasant 60 year old female, she did well after her first lumbar epidural with almost 3 months of relief. Now having a recurrence of pain and desires repeat.  Past medical history:  Negative.  See flowsheet/record as well for more information.  Surgical history: Negative.  See flowsheet/record as well for more information.  Family history: Negative.  See flowsheet/record as well for more information.  Social history: Negative.  See flowsheet/record as well for more information.  Allergies, and medications have been entered into the medical record, reviewed, and no changes needed.   Review of Systems: No fevers, chills, night sweats, weight loss, chest pain, or shortness of breath.   Objective:    General: Well Developed, well nourished, and in no acute distress.  Neuro: Alert and oriented x3, extra-ocular muscles intact, sensation grossly intact.  HEENT: Normocephalic, atraumatic, pupils equal round reactive to light, neck supple, no masses, no lymphadenopathy, thyroid nonpalpable.  Skin: Warm and dry, no rashes. Cardiac: Regular rate and rhythm, no murmurs rubs or gallops, no lower extremity edema.  Respiratory: Clear to auscultation bilaterally. Not using accessory muscles, speaking in full sentences.  Impression and Recommendations:    Left lumbar radiculopathy Repeat left L4-L5 interlaminar epidural, she got 2-1/2 months out of the previous injection. Also referral to Dr. Lynann Bologna, though I think she is doing well with conservative measures she does have moderate to severe central canal stenosis.  I spent 25 minutes with this patient, greater than 50% was face-to-face time counseling regarding the above diagnoses

## 2016-10-02 ENCOUNTER — Other Ambulatory Visit: Payer: Self-pay | Admitting: Student

## 2016-10-08 ENCOUNTER — Other Ambulatory Visit: Payer: 59

## 2016-10-09 ENCOUNTER — Ambulatory Visit
Admission: RE | Admit: 2016-10-09 | Discharge: 2016-10-09 | Disposition: A | Discharge status: Home / Self Care | Payer: 59 | Source: Ambulatory Visit | Attending: Sports Medicine | Admitting: Sports Medicine

## 2016-10-09 MED ORDER — IOPAMIDOL (ISOVUE-M 200) INJECTION 41%
1.0000 mL | Freq: Once | INTRAMUSCULAR | Status: AC
  Administered 2016-10-09: 1 mL via EPIDURAL

## 2016-10-09 MED ORDER — METHYLPREDNISOLONE ACETATE 40 MG/ML INJ SUSP (RADIOLOG
120.0000 mg | Freq: Once | INTRAMUSCULAR | Status: AC
  Administered 2016-10-09: 120 mg via EPIDURAL

## 2016-10-18 ENCOUNTER — Other Ambulatory Visit: Payer: 59

## 2016-12-10 ENCOUNTER — Telehealth: Payer: Self-pay

## 2016-12-10 DIAGNOSIS — M51369 Other intervertebral disc degeneration, lumbar region without mention of lumbar back pain or lower extremity pain: Secondary | ICD-10-CM

## 2016-12-10 DIAGNOSIS — M5136 Other intervertebral disc degeneration, lumbar region: Secondary | ICD-10-CM

## 2016-12-10 NOTE — Telephone Encounter (Signed)
Ordered

## 2016-12-10 NOTE — Telephone Encounter (Signed)
Pt left VM stating she is need of the next epidural in the series. Please assist.

## 2016-12-16 ENCOUNTER — Telehealth: Payer: Self-pay | Admitting: Sports Medicine

## 2016-12-16 MED ORDER — PREDNISONE 50 MG PO TABS: 50.0000 mg | ORAL_TABLET | Freq: Every day | ORAL | 0 refills | Status: DC

## 2016-12-16 NOTE — Telephone Encounter (Signed)
Pt called complaining of lower back pain on her rt side, pain started after gym class last Wednesday.

## 2016-12-16 NOTE — Telephone Encounter (Signed)
Adding prednisone.

## 2016-12-18 ENCOUNTER — Other Ambulatory Visit: Payer: 59

## 2016-12-19 ENCOUNTER — Other Ambulatory Visit: Payer: 59

## 2016-12-20 ENCOUNTER — Ambulatory Visit
Admission: RE | Admit: 2016-12-20 | Discharge: 2016-12-20 | Disposition: A | Discharge status: Home / Self Care | Payer: 59 | Source: Ambulatory Visit | Attending: Sports Medicine | Admitting: Sports Medicine

## 2016-12-20 DIAGNOSIS — M47817 Spondylosis without myelopathy or radiculopathy, lumbosacral region: Secondary | ICD-10-CM | POA: Diagnosis not present

## 2016-12-20 MED ORDER — METHYLPREDNISOLONE ACETATE 40 MG/ML INJ SUSP (RADIOLOG
120.0000 mg | Freq: Once | INTRAMUSCULAR | Status: AC
  Administered 2016-12-20: 120 mg via EPIDURAL

## 2016-12-20 MED ORDER — IOPAMIDOL (ISOVUE-M 200) INJECTION 41%
1.0000 mL | Freq: Once | INTRAMUSCULAR | Status: AC
Start: 2016-12-20 — End: 2016-12-20
  Administered 2016-12-20: 1 mL via EPIDURAL

## 2016-12-20 NOTE — Discharge Instructions (Signed)

## 2017-01-13 ENCOUNTER — Encounter: Payer: 59 | Admitting: Sports Medicine

## 2017-01-22 ENCOUNTER — Encounter: Payer: Self-pay | Admitting: Sports Medicine

## 2017-01-22 ENCOUNTER — Ambulatory Visit (INDEPENDENT_AMBULATORY_CARE_PROVIDER_SITE_OTHER): Payer: 59 | Admitting: Sports Medicine

## 2017-01-22 ENCOUNTER — Encounter (INDEPENDENT_AMBULATORY_CARE_PROVIDER_SITE_OTHER): Payer: Self-pay

## 2017-01-22 VITALS — BP 123/71 | HR 66 | Resp 16 | Wt 129.5 lb

## 2017-01-22 DIAGNOSIS — Z23 Encounter for immunization: Secondary | ICD-10-CM | POA: Diagnosis not present

## 2017-01-22 DIAGNOSIS — I340 Nonrheumatic mitral (valve) insufficiency: Secondary | ICD-10-CM | POA: Diagnosis not present

## 2017-01-22 DIAGNOSIS — Z0000 Encounter for general adult medical examination without abnormal findings: Secondary | ICD-10-CM | POA: Diagnosis not present

## 2017-01-22 DIAGNOSIS — M5416 Radiculopathy, lumbar region: Secondary | ICD-10-CM | POA: Diagnosis not present

## 2017-01-22 MED ORDER — CELECOXIB 200 MG PO CAPS: ORAL_CAPSULE | ORAL | 2 refills | Status: DC

## 2017-01-22 NOTE — Assessment & Plan Note (Addendum)
Annual physical as above, ordering mammogram, bone density test on routine blood work. TDAP today. Cologuard.

## 2017-01-22 NOTE — Progress Notes (Signed)
  Subjective:    CC: Annual physical exam  HPI:  Linda Lam is here for her physical, she is going to be due for a mammogram next month, due for colon cancer screening, tetanus vaccination, she refuses flu vaccination, due for some blood work.  Lumbar spinal stenosis: Previous epidurals have done well, most recent of which did not. She has seen Dr. Lynann Bologna who recommends continued conservative management, while epidurals are still working. She is not doing any NSAIDs right now. Pain is moderate, persistent, localized, it has worsened since the previous visit.  History of mitral regurgitation: Patient does desire repeat echo today.  Past medical history:  Negative.  See flowsheet/record as well for more information.  Surgical history: Negative.  See flowsheet/record as well for more information.  Family history: Negative.  See flowsheet/record as well for more information.  Social history: Negative.  See flowsheet/record as well for more information.  Allergies, and medications have been entered into the medical record, reviewed, and no changes needed.    Review of Systems: No headache, visual changes, nausea, vomiting, diarrhea, constipation, dizziness, abdominal pain, skin rash, fevers, chills, night sweats, swollen lymph nodes, weight loss, chest pain, body aches, joint swelling, muscle aches, shortness of breath, mood changes, visual or auditory hallucinations.  Objective:    General: Well Developed, well nourished, and in no acute distress.  Neuro: Alert and oriented x3, extra-ocular muscles intact, sensation grossly intact. Cranial nerves II through XII are intact, motor, sensory, and coordinative functions are all intact. HEENT: Normocephalic, atraumatic, pupils equal round reactive to light, neck supple, no masses, no lymphadenopathy, thyroid nonpalpable. Oropharynx, nasopharynx, external ear canals are unremarkable. Skin: Warm and dry, no rashes noted.  Cardiac: Regular rate and  rhythm, no murmurs rubs or gallops.  Respiratory: Clear to auscultation bilaterally. Not using accessory muscles, speaking in full sentences.  Abdominal: Soft, nontender, nondistended, positive bowel sounds, no masses, no organomegaly.  Musculoskeletal: Shoulder, elbow, wrist, hip, knee, ankle stable, and with full range of motion.   Impression and Recommendations:    The patient was counselled, risk factors were discussed, anticipatory guidance given.  Annual physical exam Annual physical as above, ordering mammogram, bone density test on routine blood work. TDAP today. Cologuard.  Left lumbar radiculopathy Had a good response to previous left L4-L5 interlaminar epidurals. Dr. Lynann Bologna agrees to conservative measures are reasonable but if epidurals lose their efficacy she would be a candidate for surgery. She does have moderate to severe central canal stenosis at this level. Agrees to add Celebrex.  Trace mitral regurgitation by prior echocardiogram Ordering a new echo per patient request

## 2017-01-22 NOTE — Assessment & Plan Note (Signed)
Ordering a new echo per patient request

## 2017-01-22 NOTE — Assessment & Plan Note (Addendum)
Had a good response to previous left L4-L5 interlaminar epidurals. Dr. Lynann Bologna agrees to conservative measures are reasonable but if epidurals lose their efficacy she would be a candidate for surgery. She does have moderate to severe central canal stenosis at this level. Agrees to add Celebrex.

## 2017-02-05 ENCOUNTER — Other Ambulatory Visit (HOSPITAL_BASED_OUTPATIENT_CLINIC_OR_DEPARTMENT_OTHER): Payer: 59

## 2017-02-12 ENCOUNTER — Other Ambulatory Visit (HOSPITAL_BASED_OUTPATIENT_CLINIC_OR_DEPARTMENT_OTHER): Payer: 59

## 2017-02-16 DIAGNOSIS — Z1211 Encounter for screening for malignant neoplasm of colon: Secondary | ICD-10-CM | POA: Diagnosis not present

## 2017-02-16 DIAGNOSIS — Z1212 Encounter for screening for malignant neoplasm of rectum: Secondary | ICD-10-CM | POA: Diagnosis not present

## 2017-02-22 LAB — COLOGUARD: COLOGUARD: POSITIVE

## 2017-02-24 ENCOUNTER — Telehealth: Payer: Self-pay | Admitting: Sports Medicine

## 2017-02-24 DIAGNOSIS — R195 Other fecal abnormalities: Secondary | ICD-10-CM | POA: Insufficient documentation

## 2017-02-24 NOTE — Assessment & Plan Note (Signed)
Referral to gastroenterology for colonoscopy  

## 2017-02-24 NOTE — Telephone Encounter (Signed)
Pt notified no questions or concerns at this time.

## 2017-02-24 NOTE — Telephone Encounter (Signed)
Please let Linda Lam no that her ColoGuard was positive and that she does need a colonoscopy for further evaluation. I am placing the referral for colonoscopy.

## 2017-02-26 ENCOUNTER — Ambulatory Visit (HOSPITAL_BASED_OUTPATIENT_CLINIC_OR_DEPARTMENT_OTHER): Payer: 59

## 2017-02-26 LAB — LIPID PANEL W/REFLEX DIRECT LDL
Cholesterol: 205 mg/dL — ABNORMAL HIGH (ref <200)
HDL: 84 mg/dL (ref >50)
LDL-Cholesterol: 103 mg/dL — ABNORMAL HIGH
Non-HDL Cholesterol (Calc): 121 mg/dL (ref <130)
Total CHOL/HDL Ratio: 2.4 Ratio (ref <5.0)
Triglycerides: 86 mg/dL (ref <150)

## 2017-02-26 LAB — CBC
HCT: 41.1 % (ref 35.0–45.0)
Hemoglobin: 13.4 g/dL (ref 11.7–15.5)
MCH: 28.9 pg (ref 27.0–33.0)
MCHC: 32.6 g/dL (ref 32.0–36.0)
MCV: 88.6 fL (ref 80.0–100.0)
MPV: 11.1 fL (ref 7.5–12.5)
Platelets: 270 10*3/uL (ref 140–400)
RBC: 4.64 MIL/uL (ref 3.80–5.10)
RDW: 13.9 % (ref 11.0–15.0)
WBC: 5.8 10*3/uL (ref 3.8–10.8)

## 2017-02-26 LAB — COMPREHENSIVE METABOLIC PANEL WITH GFR
ALT: 13 U/L (ref 6–29)
Alkaline Phosphatase: 81 U/L (ref 33–130)
Calcium: 9.5 mg/dL (ref 8.6–10.4)
Chloride: 102 mmol/L (ref 98–110)
Glucose, Bld: 85 mg/dL (ref 65–99)
Potassium: 4.6 mmol/L (ref 3.5–5.3)
Total Protein: 6.7 g/dL (ref 6.1–8.1)

## 2017-02-26 LAB — COMPREHENSIVE METABOLIC PANEL
AST: 15 U/L (ref 10–35)
Albumin: 4.4 g/dL (ref 3.6–5.1)
BUN: 14 mg/dL (ref 7–25)
CO2: 25 mmol/L (ref 20–31)
Creat: 1.02 mg/dL — ABNORMAL HIGH (ref 0.50–0.99)
Sodium: 137 mmol/L (ref 135–146)
Total Bilirubin: 0.5 mg/dL (ref 0.2–1.2)

## 2017-02-26 LAB — HIV ANTIBODY (ROUTINE TESTING W REFLEX): HIV 1&2 Ab, 4th Generation: NONREACTIVE

## 2017-02-26 LAB — HEPATITIS C ANTIBODY: HCV Ab: NEGATIVE

## 2017-02-26 LAB — HEMOGLOBIN A1C
Hgb A1c MFr Bld: 5.3 %
Mean Plasma Glucose: 105 mg/dL

## 2017-02-26 LAB — TSH: TSH: 1.56 mIU/L

## 2017-02-26 LAB — VITAMIN D 25 HYDROXY (VIT D DEFICIENCY, FRACTURES): Vit D, 25-Hydroxy: 26 ng/mL — ABNORMAL LOW (ref 30–100)

## 2017-02-27 ENCOUNTER — Other Ambulatory Visit: Payer: Self-pay | Admitting: Sports Medicine

## 2017-02-27 MED ORDER — VITAMIN D (ERGOCALCIFEROL) 1.25 MG (50000 UNIT) PO CAPS: 50000.0000 [IU] | ORAL_CAPSULE | ORAL | 0 refills | Status: DC

## 2017-03-04 ENCOUNTER — Encounter: Payer: Self-pay | Admitting: Sports Medicine

## 2017-03-10 DIAGNOSIS — R195 Other fecal abnormalities: Secondary | ICD-10-CM | POA: Diagnosis not present

## 2017-03-25 ENCOUNTER — Ambulatory Visit (INDEPENDENT_AMBULATORY_CARE_PROVIDER_SITE_OTHER): Payer: 59

## 2017-03-25 DIAGNOSIS — Z1231 Encounter for screening mammogram for malignant neoplasm of breast: Secondary | ICD-10-CM | POA: Diagnosis not present

## 2017-03-25 DIAGNOSIS — Z13820 Encounter for screening for osteoporosis: Secondary | ICD-10-CM | POA: Diagnosis not present

## 2017-04-30 DIAGNOSIS — R195 Other fecal abnormalities: Secondary | ICD-10-CM | POA: Diagnosis not present

## 2017-04-30 DIAGNOSIS — R8589 Other abnormal findings in specimens from digestive organs and abdominal cavity: Secondary | ICD-10-CM | POA: Diagnosis not present

## 2017-04-30 DIAGNOSIS — D120 Benign neoplasm of cecum: Secondary | ICD-10-CM | POA: Diagnosis not present

## 2017-04-30 LAB — HM COLONOSCOPY

## 2017-10-03 DIAGNOSIS — M21612 Bunion of left foot: Secondary | ICD-10-CM | POA: Diagnosis not present

## 2017-10-03 DIAGNOSIS — M21611 Bunion of right foot: Secondary | ICD-10-CM | POA: Diagnosis not present

## 2017-12-19 ENCOUNTER — Ambulatory Visit: Payer: 59 | Admitting: Sports Medicine

## 2017-12-19 ENCOUNTER — Encounter: Payer: Self-pay | Admitting: Sports Medicine

## 2017-12-19 DIAGNOSIS — Z01818 Encounter for other preprocedural examination: Secondary | ICD-10-CM

## 2017-12-19 DIAGNOSIS — Z0000 Encounter for general adult medical examination without abnormal findings: Secondary | ICD-10-CM | POA: Diagnosis not present

## 2017-12-19 NOTE — Assessment & Plan Note (Addendum)
Patient has scheduled a left bunionectomy, this is intermediate risk noncardiac surgery necessitating 4 metabolic equivalents of exercise capacity. She has this, checking some routine labs per anesthesiology request. She is cleared for surgery. Operation is to be performed by Dr. Jeannine Kitten, fax number [phone] attention [place].

## 2017-12-19 NOTE — Assessment & Plan Note (Signed)
Checking routine labs 

## 2017-12-19 NOTE — Progress Notes (Signed)
Subjective:    CC: Surgical clearance  HPI: This is a pleasant 62 year old female, she has stopped smoking, she is healthy, goes to the gym every day, she is having a left-sided bunionectomy in [place].  She is here for surgical clearance.  She does need some labs done.  She is able to walk up 2 flights of steps and/or 2 city blocks without chest pain, shortness of breath.  No history of bleeding with surgical procedures in the past.  No complaints.  I reviewed the past medical history, family history, social history, surgical history, and allergies today and no changes were needed.  Please see the problem list section below in epic for further details.  Past Medical History: Past Medical History:  Diagnosis Date  . Hyperlipidemia    Past Surgical History: Past Surgical History:  Procedure Laterality Date  . ABDOMINAL HYSTERECTOMY    . BUNIONECTOMY     Social History: Social History   Socioeconomic History  . Marital status: Married    Spouse name: None  . Number of children: None  . Years of education: None  . Highest education level: None  Social Needs  . Financial resource strain: None  . Food insecurity - worry: None  . Food insecurity - inability: None  . Transportation needs - medical: None  . Transportation needs - non-medical: None  Occupational History  . None  Tobacco Use  . Smoking status: Light Tobacco Smoker    Types: Cigarettes  . Smokeless tobacco: Never Used  Substance and Sexual Activity  . Alcohol use: Yes    Alcohol/week: 3.0 oz    Types: 5 Standard drinks or equivalent per week  . Drug use: No  . Sexual activity: Yes    Partners: Male    Birth control/protection: Surgical  Other Topics Concern  . None  Social History Narrative  . None   Family History: Family History  Problem Relation Age of Onset  . Hypertension Mother   . Diabetes Mother   . Stroke Father   . Diabetes Brother   . Diabetes Maternal Grandmother     Allergies: Allergies  Allergen Reactions  . Demerol [Meperidine] Nausea And Vomiting    This was given via IV push.   Medications: See med rec.  Review of Systems: No fevers, chills, night sweats, weight loss, chest pain, or shortness of breath.   Objective:    General: Well Developed, well nourished, and in no acute distress.  Neuro: Alert and oriented x3, extra-ocular muscles intact, sensation grossly intact.  HEENT: Normocephalic, atraumatic, pupils equal round reactive to light, neck supple, no masses, no lymphadenopathy, thyroid nonpalpable.  Skin: Warm and dry, no rashes. Cardiac: Regular rate and rhythm, no murmurs rubs or gallops, no lower extremity edema.  Respiratory: Clear to auscultation bilaterally. Not using accessory muscles, speaking in full sentences.  Impression and Recommendations:    Preoperative clearance Patient has scheduled a left bunionectomy, this is intermediate risk noncardiac surgery necessitating 4 metabolic equivalents of exercise capacity. She has this, checking some routine labs per anesthesiology request. She is cleared for surgery. Operation is to be performed by Dr. Jeannine Kitten, fax number [phone] attention [place].  Annual physical exam Checking routine labs.  I spent 25 minutes with this patient, greater than 50% was face-to-face time counseling regarding the above diagnoses ___________________________________________ Linda Lam. [name], M.D., ABFM., CAQSM. Primary Care and [place] Instructor of [facility]  [facility] Medicine

## 2017-12-20 LAB — LIPID PANEL W/REFLEX DIRECT LDL
Cholesterol: 203 mg/dL — ABNORMAL HIGH (ref <200)
HDL: 74 mg/dL (ref >50)
LDL Cholesterol (Calc): 108 mg/dL (calc) — ABNORMAL HIGH
Non-HDL Cholesterol (Calc): 129 mg/dL (calc) (ref <130)
Total CHOL/HDL Ratio: 2.7 (calc) (ref <5.0)
Triglycerides: 111 mg/dL (ref <150)

## 2017-12-20 LAB — CBC
HCT: 41.2 % (ref 35.0–45.0)
Hemoglobin: 13.8 g/dL (ref 11.7–15.5)
MCH: 28 pg (ref 27.0–33.0)
MCHC: 33.5 g/dL (ref 32.0–36.0)
MCV: 83.7 fL (ref 80.0–100.0)
MPV: 11.6 fL (ref 7.5–12.5)
Platelets: 227 10*3/uL (ref 140–400)
RBC: 4.92 Million/uL (ref 3.80–5.10)
RDW: 12.4 % (ref 11.0–15.0)
WBC: 5.6 10*3/uL (ref 3.8–10.8)

## 2017-12-20 LAB — COMPREHENSIVE METABOLIC PANEL WITH GFR
AG Ratio: 2.5 (calc) (ref 1.0–2.5)
AST: 15 U/L (ref 10–35)
Alkaline phosphatase (APISO): 92 U/L (ref 33–130)
BUN/Creatinine Ratio: 16 (calc) (ref 6–22)
CO2: 27 mmol/L (ref 20–32)
Chloride: 105 mmol/L (ref 98–110)
Globulin: 1.9 g/dL (ref 1.9–3.7)
Potassium: 4.3 mmol/L (ref 3.5–5.3)

## 2017-12-20 LAB — HEMOGLOBIN A1C
Hgb A1c MFr Bld: 5.5 % of total Hgb (ref <5.7)
Mean Plasma Glucose: 111 (calc)
eAG (mmol/L): 6.2 (calc)

## 2017-12-20 LAB — TSH: TSH: 1.14 m[IU]/L (ref 0.40–4.50)

## 2017-12-20 LAB — COMPREHENSIVE METABOLIC PANEL
ALT: 15 U/L (ref 6–29)
Albumin: 4.7 g/dL (ref 3.6–5.1)
BUN: 18 mg/dL (ref 7–25)
Calcium: 9.5 mg/dL (ref 8.6–10.4)
Creat: 1.12 mg/dL — ABNORMAL HIGH (ref 0.50–0.99)
Glucose, Bld: 89 mg/dL (ref 65–99)
Sodium: 140 mmol/L (ref 135–146)
Total Bilirubin: 0.6 mg/dL (ref 0.2–1.2)
Total Protein: 6.6 g/dL (ref 6.1–8.1)

## 2017-12-20 LAB — PROTIME-INR
INR: 1
Prothrombin Time: 10.3 s (ref 9.0–11.5)

## 2017-12-20 LAB — APTT: aPTT: 29 s (ref 22–34)

## 2017-12-20 LAB — VITAMIN D 25 HYDROXY (VIT D DEFICIENCY, FRACTURES): Vit D, 25-Hydroxy: 23 ng/mL — ABNORMAL LOW (ref 30–100)

## 2017-12-29 ENCOUNTER — Ambulatory Visit: Payer: 59 | Admitting: Physician Assistant

## 2017-12-29 ENCOUNTER — Encounter: Payer: Self-pay | Admitting: Physician Assistant

## 2017-12-29 VITALS — BP 148/82 | HR 60 | Temp 98.0°F | Wt 137.0 lb

## 2017-12-29 DIAGNOSIS — J22 Unspecified acute lower respiratory infection: Secondary | ICD-10-CM | POA: Diagnosis not present

## 2017-12-29 MED ORDER — AZITHROMYCIN 200 MG/5ML PO SUSR: 2000.0000 mg | Freq: Once | ORAL | 0 refills | Status: AC

## 2017-12-29 MED ORDER — GUAIFENESIN ER 600 MG PO TB12: 600.0000 mg | ORAL_TABLET | Freq: Two times a day (BID) | ORAL | Status: DC

## 2017-12-29 NOTE — Progress Notes (Signed)
HPI:                                                                Linda Lam is a 62 y.o. female who presents to [place]: [place] today for cough  Patient reports cold symptoms beginning 1 week ago. States symptoms have been gradually worsening. Complains of fatigue, malaise, and cough for the last 2 days. Patient reports cough productive of green sputum. Denies fever, chills, dyspnea, wheezing, hemoptysis. Denies history of pulmonary disease. She has been taking Alka-Seltzer with mild relief. She has a bunionectomy planned next week on Monday.  Depression screen [facility] 2/9 12/19/2017  Decreased Interest 0  Down, Depressed, Hopeless 0  PHQ - 2 Score 0    No flowsheet data found.    Past Medical History:  Diagnosis Date  . Hyperlipidemia    Past Surgical History:  Procedure Laterality Date  . ABDOMINAL HYSTERECTOMY    . BUNIONECTOMY     Social History   Tobacco Use  . Smoking status: Light Tobacco Smoker    Types: Cigarettes  . Smokeless tobacco: Never Used  Substance Use Topics  . Alcohol use: Yes    Alcohol/week: 3.0 oz    Types: 5 Standard drinks or equivalent per week   family history includes Diabetes in her brother, maternal grandmother, and mother; Hypertension in her mother; Stroke in her father.    ROS: negative except as noted in the HPI  Medications: Current Outpatient Medications  Medication Sig Dispense Refill  . celecoxib (CELEBREX) 200 MG capsule One to 2 tablets by mouth daily as needed for pain. 60 capsule 2  . Vitamin D, Ergocalciferol, (DRISDOL) 50000 units CAPS capsule Take 1 capsule (50,000 Units total) by mouth every 7 (seven) days. Take for 8 total doses(weeks) 8 capsule 0   No current facility-administered medications for this visit.    Allergies  Allergen Reactions  . Demerol [Meperidine] Nausea And Vomiting    This was given via IV push.       Objective:  BP (!) 148/82   Pulse  60   Temp 98 F (36.7 C) (Oral)   Wt 137 lb (62.1 kg)   LMP 02/25/1998   SpO2 98%   BMI 25.89 kg/m  Gen:  alert, not ill-appearing, no distress, appropriate for age 47: head normocephalic without obvious abnormality, conjunctiva and cornea clear, TM's clear bilaterally, oropharynx clear without erythema or edema, neck supple, no adenopathy, trachea midline Pulm: Normal work of breathing, normal phonation, clear to auscultation bilaterally, no wheezes, rales or rhonchi CV: Normal rate, regular rhythm, s1 and s2 distinct, no murmurs, clicks or rubs  Neuro: alert and oriented x 3, no tremor MSK: extremities atraumatic, normal gait and station Skin: intact, no rashes on exposed skin, no jaundice, no cyanosis   No results found for this or any previous visit (from the past 72 hour(s)). No results found.    Assessment and Plan: 62 y.o. female with   1. Acute lower respiratory infection - SpO2 98% on RA, no evidence of respiratory distress.  - will cover for CAP, bacterial bronchitis given second sickening and symptoms >1 week. Patient cannot swallow pills, requesting liquid antibiotic. - azithromycin (ZITHROMAX) 200 MG/5ML suspension; Take 50 mLs (2,000  mg total) by mouth once for 1 dose.  Dispense: 22.5 mL; Refill: 0 - guaiFENesin (MUCINEX) 600 MG 12 hr tablet; Take 1 tablet (600 mg total) by mouth 2 (two) times daily.  Patient education and anticipatory guidance given Patient agrees with treatment plan Follow-up as needed if symptoms worsen or fail to improve  Darlyne Russian PA-C

## 2017-12-29 NOTE — Patient Instructions (Signed)
Cough, Adult  Coughing is a reflex that clears your throat and your airways. Coughing helps to heal and protect your lungs. It is normal to cough occasionally, but a cough that happens with other symptoms or lasts a long time may be a sign of a condition that needs treatment. A cough may last only 2-3 weeks (acute), or it may last longer than 8 weeks (chronic).  What are the causes?  Coughing is commonly caused by:   Breathing in substances that irritate your lungs.   A viral or bacterial respiratory infection.   Allergies.   Asthma.   Postnasal drip.   Smoking.   Acid backing up from the stomach into the esophagus (gastroesophageal reflux).   Certain medicines.   Chronic lung problems, including COPD (or rarely, lung cancer).   Other medical conditions such as heart failure.    Follow these instructions at home:  Pay attention to any changes in your symptoms. Take these actions to help with your discomfort:   Take medicines only as told by your health care provider.  ? If you were prescribed an antibiotic medicine, take it as told by your health care provider. Do not stop taking the antibiotic even if you start to feel better.  ? Talk with your health care provider before you take a cough suppressant medicine.   Drink enough fluid to keep your urine clear or pale yellow.   If the air is dry, use a cold steam vaporizer or humidifier in your bedroom or your home to help loosen secretions.   Avoid anything that causes you to cough at work or at home.   If your cough is worse at night, try sleeping in a semi-upright position.   Avoid cigarette smoke. If you smoke, quit smoking. If you need help quitting, ask your health care provider.   Avoid caffeine.   Avoid alcohol.   Rest as needed.    Contact a health care provider if:   You have new symptoms.   You cough up pus.   Your cough does not get better after 2-3 weeks, or your cough gets worse.   You cannot control your cough with suppressant  medicines and you are losing sleep.   You develop pain that is getting worse or pain that is not controlled with pain medicines.   You have a fever.   You have unexplained weight loss.   You have night sweats.  Get help right away if:   You cough up blood.   You have difficulty breathing.   Your heartbeat is very fast.  This information is not intended to replace advice given to you by your health care provider. Make sure you discuss any questions you have with your health care provider.  Document Released: 05/17/2011 Document Revised: 04/25/2016 Document Reviewed: 01/25/2015  Elsevier Interactive Patient Education  2018 [employer]vier Inc[employer]

## 2018-01-06 DIAGNOSIS — M2012 Hallux valgus (acquired), left foot: Secondary | ICD-10-CM | POA: Diagnosis not present

## 2018-01-06 DIAGNOSIS — G8918 Other acute postprocedural pain: Secondary | ICD-10-CM | POA: Diagnosis not present

## 2018-01-06 DIAGNOSIS — M21612 Bunion of left foot: Secondary | ICD-10-CM | POA: Diagnosis not present

## 2018-01-19 ENCOUNTER — Ambulatory Visit: Payer: 59 | Admitting: Sports Medicine

## 2018-01-20 ENCOUNTER — Ambulatory Visit (INDEPENDENT_AMBULATORY_CARE_PROVIDER_SITE_OTHER): Payer: 59

## 2018-01-20 ENCOUNTER — Ambulatory Visit: Payer: 59 | Admitting: Sports Medicine

## 2018-01-20 DIAGNOSIS — Z9889 Other specified postprocedural states: Secondary | ICD-10-CM

## 2018-01-20 DIAGNOSIS — S92412A Displaced fracture of proximal phalanx of left great toe, initial encounter for closed fracture: Secondary | ICD-10-CM | POA: Diagnosis not present

## 2018-01-20 NOTE — Progress Notes (Signed)
  Subjective:    CC: Postop  HPI: Linda Lam is about a week post minimally invasive bunionectomy on the left.  She had the procedure done in [place].  She is doing okay, moderate pain, needs sutures removed, she also has some aftercare as well as some toe separator devices that she does not have.  I reviewed the past medical history, family history, social history, surgical history, and allergies today and no changes were needed.  Please see the problem list section below in epic for further details.  Past Medical History: Past Medical History:  Diagnosis Date  . Hyperlipidemia    Past Surgical History: Past Surgical History:  Procedure Laterality Date  . ABDOMINAL HYSTERECTOMY    . BUNIONECTOMY     Social History: Social History   Socioeconomic History  . Marital status: Married    Spouse name: Not on file  . Number of children: Not on file  . Years of education: Not on file  . Highest education level: Not on file  Social Needs  . Financial resource strain: Not on file  . Food insecurity - worry: Not on file  . Food insecurity - inability: Not on file  . Transportation needs - medical: Not on file  . Transportation needs - non-medical: Not on file  Occupational History  . Not on file  Tobacco Use  . Smoking status: Light Tobacco Smoker    Types: Cigarettes  . Smokeless tobacco: Never Used  Substance and Sexual Activity  . Alcohol use: Yes    Alcohol/week: 3.0 oz    Types: 5 Standard drinks or equivalent per week  . Drug use: No  . Sexual activity: Yes    Partners: Male    Birth control/protection: Surgical  Other Topics Concern  . Not on file  Social History Narrative  . Not on file   Family History: Family History  Problem Relation Age of Onset  . Hypertension Mother   . Diabetes Mother   . Stroke Father   . Diabetes Brother   . Diabetes Maternal Grandmother    Allergies: Allergies  Allergen Reactions  . Demerol [Meperidine] Nausea And Vomiting      This was given via IV push.   Medications: See med rec.  Review of Systems: No fevers, chills, night sweats, weight loss, chest pain, or shortness of breath.   Objective:    General: Well Developed, well nourished, and in no acute distress.  Neuro: Alert and oriented x3, extra-ocular muscles intact, sensation grossly intact.  HEENT: Normocephalic, atraumatic, pupils equal round reactive to light, neck supple, no masses, no lymphadenopathy, thyroid nonpalpable.  Skin: Warm and dry, no rashes. Cardiac: Regular rate and rhythm, no murmurs rubs or gallops, no lower extremity edema.  Respiratory: Clear to auscultation bilaterally. Not using accessory muscles, speaking in full sentences. Left foot: Swollen expectedly, no induration, erythema, visible K wire protruding from the dorsal midfoot.  #3 sutures were removed.  Impression and Recommendations:    S/P left bunionectomy Minimally invasive bunionectomy done in [place]. She is here postop, plans to travel back for her postop care. Internal fixation with Kirschner wire. X-rays, I removed her sutures and placed skin glue. Return as needed. ___________________________________________ Gwen Her. [name], M.D., ABFM., CAQSM. Primary Care and [place] Instructor of [place] [facility] [employer]

## 2018-01-20 NOTE — Assessment & Plan Note (Signed)
Minimally invasive bunionectomy done in [place]. She is here postop, plans to travel back for her postop care. Internal fixation with Kirschner wire. X-rays, I removed her sutures and placed skin glue. Return as needed.

## 2018-02-16 DIAGNOSIS — M21619 Bunion of unspecified foot: Secondary | ICD-10-CM | POA: Diagnosis not present

## 2018-07-20 ENCOUNTER — Other Ambulatory Visit: Payer: Self-pay | Admitting: Sports Medicine

## 2018-07-20 DIAGNOSIS — Z1231 Encounter for screening mammogram for malignant neoplasm of breast: Secondary | ICD-10-CM

## 2018-07-30 ENCOUNTER — Ambulatory Visit (INDEPENDENT_AMBULATORY_CARE_PROVIDER_SITE_OTHER): Payer: 59

## 2018-07-30 DIAGNOSIS — Z1231 Encounter for screening mammogram for malignant neoplasm of breast: Secondary | ICD-10-CM

## 2018-09-11 ENCOUNTER — Ambulatory Visit (INDEPENDENT_AMBULATORY_CARE_PROVIDER_SITE_OTHER): Payer: 59 | Admitting: Sports Medicine

## 2018-09-11 ENCOUNTER — Ambulatory Visit (INDEPENDENT_AMBULATORY_CARE_PROVIDER_SITE_OTHER): Payer: 59

## 2018-09-11 ENCOUNTER — Encounter: Payer: Self-pay | Admitting: Sports Medicine

## 2018-09-11 DIAGNOSIS — M5412 Radiculopathy, cervical region: Secondary | ICD-10-CM

## 2018-09-11 DIAGNOSIS — M4807 Spinal stenosis, lumbosacral region: Secondary | ICD-10-CM | POA: Diagnosis not present

## 2018-09-11 DIAGNOSIS — M542 Cervicalgia: Secondary | ICD-10-CM | POA: Diagnosis not present

## 2018-09-11 DIAGNOSIS — M5416 Radiculopathy, lumbar region: Secondary | ICD-10-CM

## 2018-09-11 NOTE — Assessment & Plan Note (Signed)
We will start conservative, x-rays, formal physical therapy. If no relief over the next 4 to 6 weeks we will proceed with an MRI and interventional planning.

## 2018-09-11 NOTE — Progress Notes (Signed)
Subjective:    CC: Low back pain  HPI: Linda Lam is a pleasant 62 year old female with known lumbar spinal stenosis, moderate to severe.  We have been treating her conservatively, epidurals almost 2 years ago, and she did very well.  Now having recurrence of pain with occasional radiation down the left leg, pain is worse with standing up straight, better with flexion.  We did get a second opinion from Dr. Lynann Bologna who suggested continued conservative treatment.  No bowel or bladder dysfunction, saddle numbness, constitutional symptoms, no trauma.  In addition she starting to note some soreness over her anterolateral left forearm.  Starting with pain in the neck, no overt numbness or tingling in the hands or fingertips.  No trauma.  I reviewed the past medical history, family history, social history, surgical history, and allergies today and no changes were needed.  Please see the problem list section below in epic for further details.  Past Medical History: Past Medical History:  Diagnosis Date  . Hyperlipidemia    Past Surgical History: Past Surgical History:  Procedure Laterality Date  . ABDOMINAL HYSTERECTOMY    . BUNIONECTOMY     Social History: Social History   Socioeconomic History  . Marital status: Married    Spouse name: Not on file  . Number of children: Not on file  . Years of education: Not on file  . Highest education level: Not on file  Occupational History  . Not on file  Social Needs  . Financial resource strain: Not on file  . Food insecurity:    Worry: Not on file    Inability: Not on file  . Transportation needs:    Medical: Not on file    Non-medical: Not on file  Tobacco Use  . Smoking status: Light Tobacco Smoker    Types: Cigarettes  . Smokeless tobacco: Never Used  Substance and Sexual Activity  . Alcohol use: Yes    Alcohol/week: 5.0 standard drinks    Types: 5 Standard drinks or equivalent per week  . Drug use: No  . Sexual activity: Yes   Partners: Male    Birth control/protection: Surgical  Lifestyle  . Physical activity:    Days per week: Not on file    Minutes per session: Not on file  . Stress: Not on file  Relationships  . Social connections:    Talks on phone: Not on file    Gets together: Not on file    Attends religious service: Not on file    Active member of club or organization: Not on file    Attends meetings of clubs or organizations: Not on file    Relationship status: Not on file  Other Topics Concern  . Not on file  Social History Narrative  . Not on file   Family History: Family History  Problem Relation Age of Onset  . Hypertension Mother   . Diabetes Mother   . Stroke Father   . Diabetes Brother   . Diabetes Maternal Grandmother    Allergies: Allergies  Allergen Reactions  . Demerol [Meperidine] Nausea And Vomiting    This was given via IV push.   Medications: See med rec.  Review of Systems: No fevers, chills, night sweats, weight loss, chest pain, or shortness of breath.   Objective:    General: Well Developed, well nourished, and in no acute distress.  Neuro: Alert and oriented x3, extra-ocular muscles intact, sensation grossly intact.  HEENT: Normocephalic, atraumatic, pupils equal round reactive to  light, neck supple, no masses, no lymphadenopathy, thyroid nonpalpable.  Skin: Warm and dry, no rashes. Cardiac: Regular rate and rhythm, no murmurs rubs or gallops, no lower extremity edema.  Respiratory: Clear to auscultation bilaterally. Not using accessory muscles, speaking in full sentences. Neck: Negative spurling's Full neck range of motion Grip strength and sensation normal in bilateral hands Strength good C4 to T1 distribution No sensory change to C4 to T1 Reflexes normal  Impression and Recommendations:    Left lumbar radiculopathy Good response to previous left L4-L5 interlaminar and transforaminal epidurals. She does have moderate to severe central canal stenosis  at this level. She saw Dr. Lynann Bologna who recommended continued conservative care but she would be a candidate for surgery of epidurals lose their efficacy.   Previous epidural was in January 2018. New MRI for interventional planning, and we will probably proceed with another left L4-L5 interlaminar epidural.  Radiculitis of left cervical region We will start conservative, x-rays, formal physical therapy. If no relief over the next 4 to 6 weeks we will proceed with an MRI and interventional planning. ___________________________________________ Gwen Her. [name], M.D., ABFM., CAQSM. Primary Care and [place] Instructor of [place] [facility]

## 2018-09-11 NOTE — Assessment & Plan Note (Signed)
Good response to previous left L4-L5 interlaminar and transforaminal epidurals. She does have moderate to severe central canal stenosis at this level. She saw Dr. Lynann Bologna who recommended continued conservative care but she would be a candidate for surgery of epidurals lose their efficacy.   Previous epidural was in January 2018. New MRI for interventional planning, and we will probably proceed with another left L4-L5 interlaminar epidural.

## 2018-09-28 ENCOUNTER — Ambulatory Visit (INDEPENDENT_AMBULATORY_CARE_PROVIDER_SITE_OTHER): Payer: 59

## 2018-09-28 ENCOUNTER — Ambulatory Visit: Payer: 59 | Admitting: Rehabilitative and Restorative Service Providers"

## 2018-09-28 DIAGNOSIS — M4807 Spinal stenosis, lumbosacral region: Secondary | ICD-10-CM | POA: Diagnosis not present

## 2018-09-28 DIAGNOSIS — M545 Low back pain: Secondary | ICD-10-CM | POA: Diagnosis not present

## 2018-09-28 DIAGNOSIS — M5416 Radiculopathy, lumbar region: Secondary | ICD-10-CM

## 2018-10-01 ENCOUNTER — Encounter: Payer: Self-pay | Admitting: Physical Therapy

## 2018-10-01 ENCOUNTER — Ambulatory Visit (INDEPENDENT_AMBULATORY_CARE_PROVIDER_SITE_OTHER): Payer: 59 | Admitting: Physical Therapy

## 2018-10-01 DIAGNOSIS — M542 Cervicalgia: Secondary | ICD-10-CM | POA: Diagnosis not present

## 2018-10-01 DIAGNOSIS — R293 Abnormal posture: Secondary | ICD-10-CM | POA: Diagnosis not present

## 2018-10-01 NOTE — Therapy (Signed)
[place], [place], [address] Phone: [phone]   Fax:  [phone]  Physical Therapy Evaluation  Patient Details  Name: Linda Lam MRN: [id number] Date of Birth: Dec 27, 1955 Referring Provider (PT): Silverio Decamp, MD   Encounter Date: 10/01/2018  PT End of Session - 10/01/18 0839    Visit Number  1    Number of Visits  12    Date for PT Re-Evaluation  11/12/18    PT Start Time  0803    PT Stop Time  0845    PT Time Calculation (min)  42 min    Activity Tolerance  Patient tolerated treatment well    Behavior During Therapy  [facility] for tasks assessed/performed       Past Medical History:  Diagnosis Date  . Hyperlipidemia     Past Surgical History:  Procedure Laterality Date  . ABDOMINAL HYSTERECTOMY    . BUNIONECTOMY      There were no vitals filed for this visit.   Subjective Assessment - 10/01/18 0806    Subjective  Pt is a 62 y/o female who presents to OPPT for LUE pain x 1 year which she feels is coming from neck.  Pt with reproduction of symptoms with Lt rotation of neck.      Patient Stated Goals  improve pain and symptoms    Currently in Pain?  Yes    Pain Score  0-No pain   at best 0/10; up to 4/10   Pain Location  Arm    Pain Orientation  Left    Pain Descriptors / Indicators  Aching;Radiating    Pain Type  Chronic pain    Pain Onset  More than a month ago    Pain Frequency  Intermittent    Aggravating Factors   Lt cervical rotation    Pain Relieving Factors  avoiding rotation, massage         [facility] PT Assessment - 10/01/18 0811      Assessment   Medical Diagnosis  M54.12 (ICD-10-CM) - Radiculitis of left cervical region    Referring Provider (PT)  Silverio Decamp, MD    Onset Date/Surgical Date  --   1 year   Hand Dominance  Right    Next MD Visit  10/12/18    Prior Therapy  for low back at this facility      Precautions   Precautions   None      Restrictions   Weight Bearing Restrictions  No      Balance Screen   Has the patient fallen in the past 6 months  No      Prior Function   Level of Independence  Independent    Vocation  Full time employment    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      Cognition   Overall Cognitive Status  Within Functional Limits for tasks assessed      Posture/Postural Control   Posture/Postural Control  Postural limitations    Postural Limitations  Rounded Shoulders;Forward head      ROM / Strength   AROM / PROM / Strength  AROM;Strength      AROM   Overall AROM Comments  increased pain with flexion, extension, Lt side bending    AROM Assessment Site  Cervical    Cervical Flexion  63    Cervical Extension  36    Cervical - Right Side Bend  30    Cervical - Left Side Bend  32    Cervical - Right Rotation  71    Cervical - Left Rotation  77      Strength   Strength Assessment Site  Shoulder;Elbow;Forearm;Wrist    Right/Left Shoulder  Right;Left    Right Shoulder Flexion  5/5    Right Shoulder ABduction  5/5    Right Shoulder Internal Rotation  5/5    Right Shoulder External Rotation  5/5    Left Shoulder Flexion  5/5    Left Shoulder ABduction  5/5    Left Shoulder Internal Rotation  5/5    Left Shoulder External Rotation  5/5    Right/Left Elbow  Left    Left Elbow Flexion  5/5    Left Elbow Extension  5/5    Right/Left Forearm  Left    Left Forearm Pronation  5/5    Left Forearm Supination  5/5   mild discomfort   Right/Left Wrist  Left    Left Wrist Flexion  5/5    Left Wrist Extension  5/5      Palpation   Palpation comment  trigger points and tightness in Lt cervical paraspinals and upper trap; no reproduction of symptoms; when pt symptomatic pain with palpation at wrist extensors but when asymptomatic no pain or tenderness      Special Tests    Special Tests  Cervical    Cervical Tests   Spurling's;Dictraction      Spurling's   Findings  Positive    Side  Left      Distraction Test   Findngs  Negative    Comment  pt asymptomatic when attempting to test                Objective measurements completed on examination: See above findings.      [place] Adult PT Treatment/Exercise - 10/01/18 0811      Self-Care   Self-Care  Posture    Posture  discussed office set up; pt's computer directly in front of her but desk and door to [place].  recommended turning whole body to decrease repetitive rotation to Lt      Exercises   Exercises  Neck      Neck Exercises: Seated   Shoulder Rolls  Backwards;5 reps    Other Seated Exercise  scapular retraction x 5 reps      Neck Exercises: Supine   Neck Retraction  5 reps;5 secs      Modalities   Modalities  Traction      Traction   Type of Traction  Cervical    Min (lbs)  10    Max (lbs)  15    Hold Time  60    Rest Time  20    Time  15             PT Education - 10/01/18 0839    Education Details  HEP, see self care, DN    Person(s) Educated  Patient    Methods  Explanation;Demonstration;Handout    Comprehension  Verbalized understanding;Returned demonstration          PT Long Term Goals - 10/01/18 1130      PT LONG TERM GOAL #1   Title  independent with HEP    Status  New    Target Date  11/12/18      PT LONG TERM GOAL #2  Title  perform cervical ROM without radicular symptoms for improved function    Status  New    Target Date  11/12/18      PT LONG TERM GOAL #3   Title  report centralization of symptoms for improved function    Status  New    Target Date  11/12/18      PT LONG TERM GOAL #4   Title  n/a      PT LONG TERM GOAL #5   Title  n/a             Plan - 10/01/18 1127    Clinical Impression Statement  Pt is a 62 y/o female who presents to OPPT for neck pain with Lt radicular symptoms.  Pt demonstrates poor posture, pain with ROM and active trigger points.  Pt will  benefit from PT to address deficits.     Clinical Presentation  Stable    Clinical Decision Making  Low    Rehab Potential  Good    PT Frequency  2x / week   recommend 2x/wk; may only see 1x/wk due to schedule   PT Duration  6 weeks    PT Treatment/Interventions  ADLs/Self Care Home Management;Cryotherapy;Electrical Stimulation;Iontophoresis 4mg /ml Dexamethasone;Moist Heat;Traction;Ultrasound;Therapeutic exercise;Therapeutic activities;Patient/family education;Manual techniques;Passive range of motion;Dry needling;Taping    PT Next Visit Plan  review HEP, continue traction, manual/modalities/DN PRN    PT Home Exercise Plan  Access Code: [email or web address]    Consulted and Agree with Plan of Care  Patient       Patient will benefit from skilled therapeutic intervention in order to improve the following deficits and impairments:  Pain, Postural dysfunction, Increased fascial restricitons, Increased muscle spasms, Decreased range of motion, Impaired UE functional use  Visit Diagnosis: Cervicalgia - Plan: PT plan of care cert/re-cert  Abnormal posture - Plan: PT plan of care cert/re-cert     Problem List Patient Active Problem List   Diagnosis Date Noted  . Radiculitis of left cervical region 09/11/2018  . S/P left bunionectomy 01/20/2018  . Positive colorectal cancer screening using DNA-based stool test 02/24/2017  . Left lumbar radiculopathy 04/18/2016  . Annual physical exam 06/27/2015  . Primary osteoarthritis of right knee 06/27/2015  . Primary osteoarthritis of left hip 06/27/2015  . Trace mitral regurgitation by prior echocardiogram 02/25/2013  . Tobacco use disorder 02/25/2013  . S/P hysterectomy 02/25/2013  . Fibrocystic breast disease 02/25/2013  . Eczema 02/25/2013  . Skin cancer, basal cell 02/25/2013  . Sinus bradycardia by electrocardiogram 02/25/2013      Laureen Abrahams, PT, DPT 10/01/18 11:33 AM    [facility] [place], [place], [address] Phone: [phone]   Fax:  [phone]  Name: Cason Dabney MRN: [id number] Date of Birth: January 26, 1956

## 2018-10-01 NOTE — Patient Instructions (Signed)
Access Code: [email or web address]  URL: [email or web address]  Date: 10/01/2018  Prepared by: Faustino Congress   Exercises  Supine Chin Tuck - 10 reps - 1 sets - 5 sec hold - 2x daily - 7x weekly  Seated Scapular Retraction - 10 reps - 1 sets - 5 sec hold - 2x daily - 7x weekly  Standing Backward Shoulder Rolls - 10 reps - 1 sets - 2x daily - 7x weekly  Patient Education  Trigger Point Dry Needling

## 2018-10-08 ENCOUNTER — Ambulatory Visit (INDEPENDENT_AMBULATORY_CARE_PROVIDER_SITE_OTHER): Payer: 59 | Admitting: Rehabilitative and Restorative Service Providers"

## 2018-10-08 ENCOUNTER — Encounter: Payer: Self-pay | Admitting: Rehabilitative and Restorative Service Providers"

## 2018-10-08 DIAGNOSIS — M542 Cervicalgia: Secondary | ICD-10-CM

## 2018-10-08 DIAGNOSIS — R293 Abnormal posture: Secondary | ICD-10-CM | POA: Diagnosis not present

## 2018-10-08 NOTE — Therapy (Signed)
[place], [place], [address] Phone: [phone]   Fax:  [phone]  Physical Therapy Treatment  Patient Details  Name: Linda Lam MRN: [id number] Date of Birth: 07/18/1956 Referring Provider (PT): Silverio Decamp, MD   Encounter Date: 10/08/2018  PT End of Session - 10/08/18 0808    Visit Number  2    Number of Visits  12    Date for PT Re-Evaluation  11/12/18    PT Start Time  0806    PT Stop Time  0840    PT Time Calculation (min)  34 min    Activity Tolerance  Patient tolerated treatment well       Past Medical History:  Diagnosis Date  . Hyperlipidemia     Past Surgical History:  Procedure Laterality Date  . ABDOMINAL HYSTERECTOMY    . BUNIONECTOMY      There were no vitals filed for this visit.  Subjective Assessment - 10/08/18 0808    Subjective  Pt reports that the only thing that has helped is not sleeping on the Lt side. She is sleeping on the Rt side. Symptoms continue in the day.     Currently in Pain?  Yes    Pain Score  --   0/10 to 4/10 - "comes and goes"    Pain Location  Arm    Pain Orientation  Left    Pain Descriptors / Indicators  Aching;Radiating    Pain Onset  More than a month ago    Pain Frequency  Intermittent                       [facility] Adult PT Treatment/Exercise - 10/08/18 0001      Shoulder Exercises: Standing   Other Standing Exercises  axial extension 10 sec x 5; scap squeeze 10 sec x 10 with swim noodle       Shoulder Exercises: Stretch   Other Shoulder Stretches  3 way doorway stretch as tolerated - avoiding radicular symptoms 10-20 sec x 2 lower position; middle and higher positions 20-30 sec x 2 reps - tolerated better with fewer UE symptoms       Manual Therapy   Manual therapy comments  pt supine in hooklying position     Joint Mobilization  mid cervical CPA and Lt to Rt lateral mobs    Soft tissue  mobilization  deep tissue work through the ant/lat/posterior cervical musculature; upper trap; pecs Lt > Rt     Myofascial Release  anterior chest     Manual Traction  manual traction 20-30 sec 4-5 times through the manual treatment       Neck Exercises: Stretches   Neck Stretch  5 reps;10 seconds   axial extension in supine; nodding yes/no x 5             PT Education - 10/08/18 0817    Education Details  HEP     Person(s) Educated  Patient    Methods  Explanation;Demonstration;Tactile cues;Verbal cues;Handout    Comprehension  Verbalized understanding;Returned demonstration;Verbal cues required;Tactile cues required          PT Long Term Goals - 10/01/18 1130      PT LONG TERM GOAL #1   Title  independent with HEP    Status  New    Target Date  11/12/18      PT LONG TERM GOAL #2   Title  perform  cervical ROM without radicular symptoms for improved function    Status  New    Target Date  11/12/18      PT LONG TERM GOAL #3   Title  report centralization of symptoms for improved function    Status  New    Target Date  11/12/18      PT LONG TERM GOAL #4   Title  n/a      PT LONG TERM GOAL #5   Title  n/a            Plan - 10/08/18 0808    Clinical Impression Statement  Patient added exercise with only minor increase in Lt UE symptoms which resolved within a few seconds of stopping the exercise. Good response to manual work with increase in tissue extensibility. Patient declined DN; traction; modalites today.     Rehab Potential  Good    PT Frequency  2x / week   recommend 2x/wk; may only see 1x/wk due to schedule   PT Duration  6 weeks    PT Treatment/Interventions  ADLs/Self Care Home Management;Cryotherapy;Electrical Stimulation;Iontophoresis 4mg /ml Dexamethasone;Moist Heat;Traction;Ultrasound;Therapeutic exercise;Therapeutic activities;Patient/family education;Manual techniques;Passive range of motion;Dry needling;Taping    PT Next Visit Plan  review  HEP, continue traction, manual/modalities/DN PRN    PT Home Exercise Plan  Access Code: [email or web address]    Consulted and Agree with Plan of Care  Patient       Patient will benefit from skilled therapeutic intervention in order to improve the following deficits and impairments:  Pain, Postural dysfunction, Increased fascial restricitons, Increased muscle spasms, Decreased range of motion, Impaired UE functional use  Visit Diagnosis: Cervicalgia  Abnormal posture     Problem List Patient Active Problem List   Diagnosis Date Noted  . Radiculitis of left cervical region 09/11/2018  . S/P left bunionectomy 01/20/2018  . Positive colorectal cancer screening using DNA-based stool test 02/24/2017  . Left lumbar radiculopathy 04/18/2016  . Annual physical exam 06/27/2015  . Primary osteoarthritis of right knee 06/27/2015  . Primary osteoarthritis of left hip 06/27/2015  . Trace mitral regurgitation by prior echocardiogram 02/25/2013  . Tobacco use disorder 02/25/2013  . S/P hysterectomy 02/25/2013  . Fibrocystic breast disease 02/25/2013  . Eczema 02/25/2013  . Skin cancer, basal cell 02/25/2013  . Sinus bradycardia by electrocardiogram 02/25/2013    Linda Lam Linda Lam PT, MPH 10/08/2018, 8:43 AM  [facility] [place], [place], [address] Phone: [phone]   Fax:  [phone]  Name: Linda Lam MRN: [id number] Date of Birth: Aug 17, 1956

## 2018-10-08 NOTE — Patient Instructions (Signed)
Axial Extension (Chin Tuck)    Pull chin in and lengthen back of neck. Hold __5__ seconds while counting out loud. Repeat __10__ times. Do __several__ sessions per day.  Shoulder Blade Squeeze    Rotate shoulders back, then squeeze shoulder blades down and back hold 10 sec Repeat __10__ times. Do __several __ sessions per day.  Scapula Adduction With Pectoralis Stretch: Low - Standing   Shoulders at 45 hands even with shoulders, keeping weight through legs, shift weight forward until you feel pull or stretch through the front of your chest. Hold _30__ seconds. Do _3__ times, _2-4__ times per day.   Scapula Adduction With Pectoralis Stretch: Mid-Range - Standing   Shoulders at 90 elbows even with shoulders, keeping weight through legs, shift weight forward until you feel pull or strength through the front of your chest. Hold __30_ seconds. Do _3__ times, __2-4_ times per day.   Scapula Adduction With Pectoralis Stretch: High - Standing   Shoulders at 120 hands up high on the doorway, keeping weight on feet, shift weight forward until you feel pull or stretch through the front of your chest. Hold _30__ seconds. Do _3__ times, _2-3__ times per day.    [facility] [place], [place] [address]  [phone] (office) [phone] (fax)

## 2018-10-12 ENCOUNTER — Encounter: Payer: Self-pay | Admitting: Sports Medicine

## 2018-10-12 ENCOUNTER — Ambulatory Visit (INDEPENDENT_AMBULATORY_CARE_PROVIDER_SITE_OTHER): Payer: 59 | Admitting: Sports Medicine

## 2018-10-12 DIAGNOSIS — M5412 Radiculopathy, cervical region: Secondary | ICD-10-CM | POA: Diagnosis not present

## 2018-10-12 DIAGNOSIS — M5416 Radiculopathy, lumbar region: Secondary | ICD-10-CM | POA: Diagnosis not present

## 2018-10-12 NOTE — Assessment & Plan Note (Addendum)
Persistent discomfort, not being all that diligent with her home rehab exercises. Also not taking Celebrex. If she has persistent discomfort I would recommend she start Celebrex before we get an MRI for interventional planning.  Update 11/08/18:  Failed conservative measures, MRI showing 3 level moderate spinal stenosis, I would argue there is some myelomalacia at these levels and that this should be evaluated surgically for ACDF.  It would likely be a 3 level fusion.   Referral to [facility] placed.

## 2018-10-12 NOTE — Assessment & Plan Note (Signed)
Excellent response to previous left L4-L5 interlaminar and transforaminal epidurals. Central canal stenosis at this level and multiple other levels. New MRI shows progression of the same central canal stenosis, she will continue with therapy, take her Celebrex if persistent discomfort, we can proceed with an epidural should she desire, she just needs to call me for it.

## 2018-10-12 NOTE — Progress Notes (Addendum)
Subjective:    CC: Follow-up  HPI: Cervical and lumbar DDD, with central canal stenosis, working in physical therapy, may be slight improvement.  Not taking her Celebrex, has not noted discomfort to be bad enough.  I reviewed the past medical history, family history, social history, surgical history, and allergies today and no changes were needed.  Please see the problem list section below in epic for further details.  Past Medical History: Past Medical History:  Diagnosis Date  . Hyperlipidemia    Past Surgical History: Past Surgical History:  Procedure Laterality Date  . ABDOMINAL HYSTERECTOMY    . BUNIONECTOMY     Social History: Social History   Socioeconomic History  . Marital status: Married    Spouse name: Not on file  . Number of children: Not on file  . Years of education: Not on file  . Highest education level: Not on file  Occupational History  . Not on file  Social Needs  . Financial resource strain: Not on file  . Food insecurity:    Worry: Not on file    Inability: Not on file  . Transportation needs:    Medical: Not on file    Non-medical: Not on file  Tobacco Use  . Smoking status: Light Tobacco Smoker    Types: Cigarettes  . Smokeless tobacco: Never Used  Substance and Sexual Activity  . Alcohol use: Yes    Alcohol/week: 5.0 standard drinks    Types: 5 Standard drinks or equivalent per week  . Drug use: No  . Sexual activity: Yes    Partners: Male    Birth control/protection: Surgical  Lifestyle  . Physical activity:    Days per week: Not on file    Minutes per session: Not on file  . Stress: Not on file  Relationships  . Social connections:    Talks on phone: Not on file    Gets together: Not on file    Attends religious service: Not on file    Active member of club or organization: Not on file    Attends meetings of clubs or organizations: Not on file    Relationship status: Not on file  Other Topics Concern  . Not on file  Social  History Narrative  . Not on file   Family History: Family History  Problem Relation Age of Onset  . Hypertension Mother   . Diabetes Mother   . Stroke Father   . Diabetes Brother   . Diabetes Maternal Grandmother    Allergies: Allergies  Allergen Reactions  . Demerol [Meperidine] Nausea And Vomiting    This was given via IV push.   Medications: See med rec.  Review of Systems: No fevers, chills, night sweats, weight loss, chest pain, or shortness of breath.   Objective:    General: Well Developed, well nourished, and in no acute distress.  Neuro: Alert and oriented x3, extra-ocular muscles intact, sensation grossly intact.  HEENT: Normocephalic, atraumatic, pupils equal round reactive to light, neck supple, no masses, no lymphadenopathy, thyroid nonpalpable.  Skin: Warm and dry, no rashes. Cardiac: Regular rate and rhythm, no murmurs rubs or gallops, no lower extremity edema.  Respiratory: Clear to auscultation bilaterally. Not using accessory muscles, speaking in full sentences.  Impression and Recommendations:    Radiculitis of left cervical region Persistent discomfort, not being all that diligent with her home rehab exercises. Also not taking Celebrex. If she has persistent discomfort I would recommend she start Celebrex before we get  an MRI for interventional planning.  Update 11/08/18:  Failed conservative measures, MRI showing 3 level moderate spinal stenosis, I would argue there is some myelomalacia at these levels and that this should be evaluated surgically for ACDF.  It would likely be a 3 level fusion.   Referral to [facility] : [facility].   Left lumbar radiculopathy Excellent response to previous left L4-L5 interlaminar and transforaminal epidurals. Central canal stenosis at this level and multiple other levels. New MRI shows progression of the same central canal stenosis, she will continue with therapy, take her Celebrex if persistent discomfort, we can proceed with  an epidural should she desire, she just needs to call me for it. ___________________________________________ Gwen Her. [name], M.D., ABFM., CAQSM. Primary Care and Sports Medicine [employer] [facility]  Adjunct Professor of [employer]

## 2018-10-15 ENCOUNTER — Ambulatory Visit (INDEPENDENT_AMBULATORY_CARE_PROVIDER_SITE_OTHER): Payer: 59 | Admitting: Physical Therapy

## 2018-10-15 DIAGNOSIS — R293 Abnormal posture: Secondary | ICD-10-CM | POA: Diagnosis not present

## 2018-10-15 DIAGNOSIS — M542 Cervicalgia: Secondary | ICD-10-CM | POA: Diagnosis not present

## 2018-10-15 NOTE — Therapy (Signed)
[place], [place], [address] Phone: [phone]   Fax:  [phone]  Physical Therapy Treatment  Patient Details  Name: Linda Lam MRN: [id number] Date of Birth: 1956-03-04 Referring Provider (PT): Silverio Decamp, MD   Encounter Date: 10/15/2018  PT End of Session - 10/15/18 0817    Visit Number  3    Number of Visits  12    Date for PT Re-Evaluation  11/12/18    PT Start Time  0805    PT Stop Time  0900    PT Time Calculation (min)  55 min       Past Medical History:  Diagnosis Date  . Hyperlipidemia     Past Surgical History:  Procedure Laterality Date  . ABDOMINAL HYSTERECTOMY    . BUNIONECTOMY      There were no vitals filed for this visit.  Subjective Assessment - 10/15/18 0817    Subjective  "The celebrex is helping my back, but doing nothing for my neck/ arm".  She is frustrated that she continues to have a nagging pain in her Lt forearm intermittently throughout day; worsened pain with Lt head rotation.  She is not currently interested in trying DN, "I tried it for my back and it didn't help".      Currently in Pain?  No/denies    Pain Score  0-No pain   up to 5/10        [facility] PT Assessment - 10/15/18 0001      Assessment   Medical Diagnosis  M54.12 (ICD-10-CM) - Radiculitis of left cervical region    Referring Provider (PT)  Silverio Decamp, MD    Onset Date/Surgical Date  --   1 year   Hand Dominance  Right    Next MD Visit  10/12/18    Prior Therapy  for low back at this facility         [facility] Adult PT Treatment/Exercise - 10/15/18 0001      Self-Care   Self-Care  Other Self-Care Comments    Other Self-Care Comments   discussed at length avoiding agrivating factors at gym like overhead press and chest press - anything that will cause scalenes and pec minor to be tightened; pt verbalized understanding       Exercises   Exercises  Neck       Neck Exercises: Seated   Other Seated Exercise  scapular retraction x 5 reps- freq cues on head position;  W's and L's x 5 reps each     Other Seated Exercise  median nerve glide x 5 reps - painful; moved to supine       Neck Exercises: Supine   Other Supine Exercise  median nerve glide x10 reps - freq cues on technique for lateral flexion and not rotation of neck.       Shoulder Exercises: Stretch   Other Shoulder Stretches  3 way doorway stretch as tolerated - avoiding radicular symptoms 10-20 sec x 2 lower position; middle and higher positions 20-30 sec x 2 reps - tolerated better with fewer UE symptoms    continual cues on form and to avoid symptoms     Modalities   Modalities  Electrical Stimulation;Moist Heat      Moist Heat Therapy   Number Minutes Moist Heat  15 Minutes    Moist Heat Location  Cervical;Shoulder;Elbow   Lt     Electrical Stimulation   Electrical Stimulation Location  Lt mid ulna and lateral epicondyle/  Lt upper trap and rhomboid    Electrical Stimulation Action  premod to each area    Electrical Stimulation Parameters  to pt tolerance x 15 min     Electrical Stimulation Goals  Pain      Manual Therapy   Manual therapy comments  pt supine in hooklying position     Soft tissue mobilization  STM to bilat upper trap, levator, and cervical paraspinals (L>R), Lt scalenes (pt reported intermittent symptoms at mid Lt ulna with pressure to pec minor and scalenes    Myofascial Release  Lt scalene, pec release, platysma             PT Education - 10/15/18 0826    Education Details  avoid chest and over head press at gym, self massage.           PT Long Term Goals - 10/01/18 1130      PT LONG TERM GOAL #1   Title  independent with HEP    Status  New    Target Date  11/12/18      PT LONG TERM GOAL #2   Title  perform cervical ROM without radicular symptoms for improved function    Status  New    Target Date  11/12/18      PT LONG TERM GOAL  #3   Title  report centralization of symptoms for improved function    Status  New    Target Date  11/12/18      PT LONG TERM GOAL #4   Title  n/a      PT LONG TERM GOAL #5   Title  n/a            Plan - 10/15/18 1103    Clinical Impression Statement  Pt had intermittent symptoms into LUE with exercises today; time spent educating pt regarding musculature of neck and shoulder in relation to brachial plexus and arteries (shown pictures for improved understanding).  Pt also had intermittent symptoms during manual therapy, with any pressure into Lt scalenes or pec minor.  Pt reported reduction of symptoms in LUE after MHP/estim.   Encouraged pt to avoid agrivating factors like certain resistance exercises that tighten neck and chest.  No new goals met yet; only 3rd visit.      Rehab Potential  Good    PT Frequency  2x / week    PT Duration  6 weeks    PT Treatment/Interventions  ADLs/Self Care Home Management;Cryotherapy;Electrical Stimulation;Iontophoresis 50m/ml Dexamethasone;Moist Heat;Traction;Ultrasound;Therapeutic exercise;Therapeutic activities;Patient/family education;Manual techniques;Passive range of motion;Dry needling;Taping       Patient will benefit from skilled therapeutic intervention in order to improve the following deficits and impairments:  Pain, Postural dysfunction, Increased fascial restricitons, Increased muscle spasms, Decreased range of motion, Impaired UE functional use  Visit Diagnosis: Cervicalgia  Abnormal posture     Problem List Patient Active Problem List   Diagnosis Date Noted  . Radiculitis of left cervical region 09/11/2018  . S/P left bunionectomy 01/20/2018  . Positive colorectal cancer screening using DNA-based stool test 02/24/2017  . Left lumbar radiculopathy 04/18/2016  . Annual physical exam 06/27/2015  . Primary osteoarthritis of right knee 06/27/2015  . Primary osteoarthritis of left hip 06/27/2015  . Trace mitral regurgitation  by prior echocardiogram 02/25/2013  . Tobacco use disorder 02/25/2013  . S/P hysterectomy 02/25/2013  . Fibrocystic breast disease 02/25/2013  . Eczema 02/25/2013  . Skin cancer, basal cell 02/25/2013  .  Sinus bradycardia by electrocardiogram 02/25/2013   Kerin Perna, PTA 10/15/18 1:30 PM  [place], [place], [address] Phone: [phone]   Fax:  [phone]  Name: Ashawna Hanback MRN: [id number] Date of Birth: October 22, 1956

## 2018-10-15 NOTE — Patient Instructions (Signed)
Neurovascular: Median Nerve Glide With Cervical Bias - Supine    Lie with neck supported, right arm out to side, elbow straight, thumb down, fingers and wrist bent back. Slowly move opposite side ear toward shoulder as far as possible without pain. Repeat __10__ times per set. Do __1__ sets per session.

## 2018-10-22 ENCOUNTER — Ambulatory Visit (INDEPENDENT_AMBULATORY_CARE_PROVIDER_SITE_OTHER): Payer: 59 | Admitting: Physical Therapy

## 2018-10-22 DIAGNOSIS — R293 Abnormal posture: Secondary | ICD-10-CM | POA: Diagnosis not present

## 2018-10-22 DIAGNOSIS — M542 Cervicalgia: Secondary | ICD-10-CM

## 2018-10-22 NOTE — Therapy (Signed)
[place], [place], [address] Phone: [phone]   Fax:  [phone]  Physical Therapy Treatment  Patient Details  Name: Linda Lam MRN: [id number] Date of Birth: [birth year] Referring Provider (PT): Silverio Decamp, MD   Encounter Date: 10/22/2018  PT End of Session - 10/22/18 0815    Visit Number  4    Number of Visits  12    Date for PT Re-Evaluation  11/12/18    PT Start Time  0810   late, traffic   PT Stop Time  0850    PT Time Calculation (min)  40 min    Activity Tolerance  Patient tolerated treatment well    Behavior During Therapy  [facility] for tasks assessed/performed       Past Medical History:  Diagnosis Date  . Hyperlipidemia     Past Surgical History:  Procedure Laterality Date  . ABDOMINAL HYSTERECTOMY    . BUNIONECTOMY      There were no vitals filed for this visit.  Subjective Assessment - 10/22/18 0815    Subjective  "Everything is the same".   Pt voices frustration that she continues to have symptoms in her Lt forearm when ever she turns her head to the left.  She has decreased overhead weights to 8# and continues with kickboxing, her favorite calorie burning workout.     Currently in Pain?  No/denies    Pain Score  --   up to 4/10 when turning head Lt   Multiple Pain Sites  No    Pain Score  --    Pain Location  --    Pain Orientation  --         [facility] PT Assessment - 10/22/18 0001      Assessment   Medical Diagnosis  M54.12 (ICD-10-CM) - Radiculitis of left cervical region    Referring Provider (PT)  Silverio Decamp, MD    Onset Date/Surgical Date  --   1 year   Hand Dominance  Right    Next MD Visit  10/12/18    Prior Therapy  for low back at this facility       [facility] Adult PT Treatment/Exercise - 10/22/18 0001      Self-Care   Other Self-Care Comments   discussed at length avoiding agrivating factors at gym like kickboxing,  overhead press and chest press - anything that will cause scalenes and pec minor to be tightened; pt verbalized understanding and indicated she will change workout schedule.  Also discussed changing work station or method she uses to see people to her Lt.       Neck Exercises: Supine   Other Supine Exercise  median nerve glide x 5 reps - cues on technique;  prolonged horiz abd in supine for pec stretch, then 10 active snow angels, to tolerance.       Shoulder Exercises: Stretch   Other Shoulder Stretches  low and mid level doorway stretch x 15 sec x 2 reps each, pt complained of increased pain in Lt forearm.       Modalities   Modalities  Electrical Stimulation;Moist Heat      Moist Heat Therapy   Number Minutes Moist Heat  15 Minutes    Moist Heat Location  Cervical;Shoulder;Elbow   Lt     Electrical Stimulation   Electrical Stimulation Location  Lt mid ulna and lateral epicondyle/  Lt upper trap and cervical paraspinals  Electrical Stimulation Action  premod to each area    Electrical Stimulation Parameters  to pt tolerance    Electrical Stimulation Goals  Pain      Manual Therapy   Manual Therapy  Soft tissue mobilization;Taping    Soft tissue mobilization  IASTM with Edge tool to Lt scalenes, upper trap and cervical paraspinals (Lt and Rt) to decrease fascial restrictions, pain and improved ROM.      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              PT Education - 10/22/18 0906    Education Details  kinesiotape info    Person(s) Educated  Patient    Methods  Explanation    Comprehension  Verbalized understanding          PT Long Term Goals - 10/01/18 1130      PT LONG TERM GOAL #1   Title  independent with HEP    Status  New    Target Date   11/12/18      PT LONG TERM GOAL #2   Title  perform cervical ROM without radicular symptoms for improved function    Status  New    Target Date  11/12/18      PT LONG TERM GOAL #3   Title  report centralization of symptoms for improved function    Status  New    Target Date  11/12/18      PT LONG TERM GOAL #4   Title  n/a      PT LONG TERM GOAL #5   Title  n/a            Plan - 10/22/18 1052    Clinical Impression Statement  Pt continues to report similar symptoms to last visit, however she reports she has not stopped doing activities that increase her symptoms, just decreased weights being used. Pt encouraged to hold off on UE lifting and arrange her work station to avoid continued agrivation of symptoms.  Trial of IASTM to Lt neck to decrease fascial tightness and sensitive skin rock tape to increase postural awareness and decompress tissue. Pt will benefit from continued PT intervention to maximize functional mobility with less pain.      Rehab Potential  Good    PT Frequency  2x / week    PT Duration  6 weeks    PT Treatment/Interventions  ADLs/Self Care Home Management;Cryotherapy;Electrical Stimulation;Iontophoresis 4mg /ml Dexamethasone;Moist Heat;Traction;Ultrasound;Therapeutic exercise;Therapeutic activities;Patient/family education;Manual techniques;Passive range of motion;Dry needling;Taping    PT Next Visit Plan  assess response to tape and IASTM.  continue postural strengthening and adjust HEP as needed.     PT Home Exercise Plan  switch doorway stretch to supine prolonged shoulder abdct; hold kickboxing class and overhead lifting for body pump.        Patient will benefit from skilled therapeutic intervention in order to improve the following deficits and impairments:  Pain, Postural dysfunction, Increased fascial restricitons, Increased muscle spasms, Decreased range of motion, Impaired UE functional use  Visit Diagnosis: Cervicalgia  Abnormal  posture     Problem List Patient Active Problem List   Diagnosis Date Noted  . Radiculitis of left cervical region 09/11/2018  . S/P left bunionectomy 01/20/2018  . Positive colorectal cancer screening  using DNA-based stool test 02/24/2017  . Left lumbar radiculopathy 04/18/2016  . Annual physical exam 06/27/2015  . Primary osteoarthritis of right knee 06/27/2015  . Primary osteoarthritis of left hip 06/27/2015  . Trace mitral regurgitation by prior echocardiogram 02/25/2013  . Tobacco use disorder 02/25/2013  . S/P hysterectomy 02/25/2013  . Fibrocystic breast disease 02/25/2013  . Eczema 02/25/2013  . Skin cancer, basal cell 02/25/2013  . Sinus bradycardia by electrocardiogram 02/25/2013   Kerin Perna, PTA 10/22/18 10:58 AM  [facility] [place], [place], [address] Phone: [phone]   Fax:  [phone]  Name: Linda Lam MRN: [id number] Date of Birth: [birth year]

## 2018-10-22 NOTE — Patient Instructions (Signed)

## 2018-10-26 ENCOUNTER — Ambulatory Visit (INDEPENDENT_AMBULATORY_CARE_PROVIDER_SITE_OTHER): Payer: 59 | Admitting: Family Medicine

## 2018-10-26 ENCOUNTER — Encounter: Payer: Self-pay | Admitting: Family Medicine

## 2018-10-26 VITALS — BP 172/80 | HR 72 | Ht 61.0 in | Wt 133.0 lb

## 2018-10-26 DIAGNOSIS — S39012A Strain of muscle, fascia and tendon of lower back, initial encounter: Secondary | ICD-10-CM

## 2018-10-26 MED ORDER — CYCLOBENZAPRINE HCL 10 MG PO TABS: 5.0000 mg | ORAL_TABLET | Freq: Three times a day (TID) | ORAL | 0 refills | Status: AC | PRN

## 2018-10-26 NOTE — Patient Instructions (Addendum)
Thank you for coming in today. Continue Celebrex as needed.  Use cyclobenzaprine muscle spasm medicine as needed mostly at bedtime.   Use heating pad  Use TENS\ unit as needed.   TENS UNIT: This is helpful for muscle pain and spasm.   Search and Purchase a TENS 7000 2nd edition at  www.[email or web address] or [email or web address] It should be less than $30.     TENS unit instructions: Do not shower or bathe with the unit on Turn the unit off before removing electrodes or batteries If the electrodes lose stickiness add a drop of water to the electrodes after they are disconnected from the unit and place on plastic sheet. If you continued to have difficulty, call the TENS unit company to purchase more electrodes. Do not apply lotion on the skin area prior to use. Make sure the skin is clean and dry as this will help prolong the life of the electrodes. After use, always check skin for unusual red areas, rash or other skin difficulties. If there are any skin problems, does not apply electrodes to the same area. Never remove the electrodes from the unit by pulling the wires. Do not use the TENS unit or electrodes other than as directed. Do not change electrode placement without consultating your therapist or physician. Keep 2 fingers with between each electrode. Wear time ratio is 2:1, on to off times.    For example on for 30 minutes off for 15 minutes and then on for 30 minutes off for 15 minutes     Lumbosacral Strain Lumbosacral strain is an injury that causes pain in the lower back (lumbosacral spine). This injury usually occurs from overstretching the muscles or ligaments along your spine. A strain can affect one or more muscles or cord-like tissues that connect bones to other bones (ligaments). What are the causes? This condition may be caused by: A hard, direct hit (blow) to the back. Excessive stretching of the lower back muscles. This may result from: A fall. Lifting something  heavy. Repetitive movements such as bending or crouching.  What increases the risk? The following factors may increase your risk of getting this condition: Participating in sports or activities that involve: A sudden twist of the back. Pushing or pulling motions. Being overweight or obese. Having poor strength and flexibility, especially tight hamstrings or weak muscles in the back or abdomen. Having too much of a curve in the lower back. Having a pelvis that is tilted forward.  What are the signs or symptoms? The main symptom of this condition is pain in the lower back, at the site of the strain. Pain may extend (radiate) down one or both legs. How is this diagnosed? This condition is diagnosed based on: Your symptoms. Your medical history. A physical exam. Your health care provider may push on certain areas of your back to determine the source of your pain. You may be asked to bend forward, backward, and side to side to assess the severity of your pain and your range of motion. Imaging tests, such as: X-rays. MRI.  How is this treated? Treatment for this condition may include: Putting heat and cold on the affected area. Medicines to help relieve pain and relax your muscles (muscle relaxants). NSAIDs to help reduce swelling and discomfort.  When your symptoms improve, it is important to gradually return to your normal routine as soon as possible to reduce pain, avoid stiffness, and avoid loss of muscle strength. Generally, symptoms should improve within 6 weeks of treatment.  However, recovery time varies. Follow these instructions at home: Managing pain, stiffness, and swelling  If directed, put ice on the injured area during the first 24 hours after your strain. Put ice in a plastic bag. Place a towel between your skin and the bag. Leave the ice on for 20 minutes, 2-3 times a day. If directed, put heat on the affected area as often as told by your health care provider. Use  the heat source that your health care provider recommends, such as a moist heat pack or a heating pad. Place a towel between your skin and the heat source. Leave the heat on for 20-30 minutes. Remove the heat if your skin turns bright red. This is especially important if you are unable to feel pain, heat, or cold. You may have a greater risk of getting burned. Activity Rest and return to your normal activities as told by your health care provider. Ask your health care provider what activities are safe for you. Avoid activities that take a lot of energy for as long as told by your health care provider. General instructions Take over-the-counter and prescription medicines only as told by your health care provider. Donot drive or use heavy machinery while taking prescription pain medicine. Do not use any products that contain nicotine or tobacco, such as cigarettes and e-cigarettes. If you need help quitting, ask your health care provider. Keep all follow-up visits as told by your health care provider. This is important. How is this prevented? Use correct form when playing sports and lifting heavy objects. Use good posture when sitting and standing. Maintain a healthy weight. Sleep on a mattress with medium firmness to support your back. Be safe and responsible while being active to avoid falls. Do at least 150 minutes of moderate-intensity exercise each week, such as brisk walking or water aerobics. Try a form of exercise that takes stress off your back, such as swimming or stationary cycling. Maintain physical fitness, including: Strength. Flexibility. Cardiovascular fitness. Endurance. Contact a health care provider if: Your back pain does not improve after 6 weeks of treatment. Your symptoms get worse. Get help right away if: Your back pain is severe. You cannot stand or walk. You have difficulty controlling when you urinate or when you have a bowel movement. You feel nauseous or you  vomit. Your feet get very cold. You have numbness, tingling, weakness, or problems using your arms or legs. You develop any of the following: Shortness of breath. Dizziness. Pain in your legs. Weakness in your buttocks or legs. Discoloration of the skin on your toes or legs. This information is not intended to replace advice given to you by your health care provider. Make sure you discuss any questions you have with your health care provider. Document Released: 08/28/2005 Document Revised: 06/07/2016 Document Reviewed: 04/21/2016 Elsevier Interactive Patient Education  2018 [employer].   Cyclobenzaprine tablets What is this medicine? CYCLOBENZAPRINE (sye kloe BEN za preen) is a muscle relaxer. It is used to treat muscle pain, spasms, and stiffness. This medicine may be used for other purposes; ask your health care provider or pharmacist if you have questions. COMMON BRAND NAME(S): Fexmid, Flexeril What should I tell my health care provider before I take this medicine? They need to know if you have any of these conditions: -heart disease, irregular heartbeat, or previous heart attack -liver disease -thyroid problem -an unusual or allergic reaction to cyclobenzaprine, tricyclic antidepressants, lactose, other medicines, foods, dyes, or preservatives -pregnant or trying to get  pregnant -breast-feeding How should I use this medicine? Take this medicine by mouth with a glass of water. Follow the directions on the prescription label. If this medicine upsets your stomach, take it with food or milk. Take your medicine at regular intervals. Do not take it more often than directed. Talk to your pediatrician regarding the use of this medicine in children. Special care may be needed. Overdosage: If you think you have taken too much of this medicine contact a poison control center or emergency room at once. NOTE: This medicine is only for you. Do not share this medicine with others. What if I  miss a dose? If you miss a dose, take it as soon as you can. If it is almost time for your next dose, take only that dose. Do not take double or extra doses. What may interact with this medicine? Do not take this medicine with any of the following medications: -certain medicines for fungal infections like fluconazole, itraconazole, ketoconazole, posaconazole, voriconazole -cisapride -dofetilide -dronedarone -halofantrine -levomethadyl -MAOIs like Carbex, Eldepryl, Marplan, Nardil, and Parnate -narcotic medicines for cough -pimozide -thioridazine -ziprasidone This medicine may also interact with the following medications: -alcohol -antihistamines for allergy, cough and cold -certain medicines for anxiety or sleep -certain medicines for cancer -certain medicines for depression like amitriptyline, fluoxetine, sertraline -certain medicines for infection like alfuzosin, chloroquine, clarithromycin, levofloxacin, mefloquine, pentamidine, troleandomycin -certain medicines for irregular heart beat -certain medicines for seizures like phenobarbital, primidone -contrast dyes -general anesthetics like halothane, isoflurane, methoxyflurane, propofol -local anesthetics like lidocaine, pramoxine, tetracaine -medicines that relax muscles for surgery -narcotic medicines for pain -other medicines that prolong the QT interval (cause an abnormal heart rhythm) -phenothiazines like chlorpromazine, mesoridazine, prochlorperazine This list may not describe all possible interactions. Give your health care provider a list of all the medicines, herbs, non-prescription drugs, or dietary supplements you use. Also tell them if you smoke, drink alcohol, or use illegal drugs. Some items may interact with your medicine. What should I watch for while using this medicine? Tell your doctor or health care professional if your symptoms do not start to get better or if they get worse. You may get drowsy or dizzy. Do not  drive, use machinery, or do anything that needs mental alertness until you know how this medicine affects you. Do not stand or sit up quickly, especially if you are an older patient. This reduces the risk of dizzy or fainting spells. Alcohol may interfere with the effect of this medicine. Avoid alcoholic drinks. If you are taking another medicine that also causes drowsiness, you may have more side effects. Give your health care provider a list of all medicines you use. Your doctor will tell you how much medicine to take. Do not take more medicine than directed. Call emergency for help if you have problems breathing or unusual sleepiness. Your mouth may get dry. Chewing sugarless gum or sucking hard candy, and drinking plenty of water may help. Contact your doctor if the problem does not go away or is severe. What side effects may I notice from receiving this medicine? Side effects that you should report to your doctor or health care professional as soon as possible: -allergic reactions like skin rash, itching or hives, swelling of the face, lips, or tongue -breathing problems -chest pain -fast, irregular heartbeat -hallucinations -seizures -unusually weak or tired Side effects that usually do not require medical attention (report to your doctor or health care professional if they continue or are bothersome): -headache -nausea, vomiting  This list may not describe all possible side effects. Call your doctor for medical advice about side effects. You may report side effects to FDA at 1-800-FDA-1088. Where should I keep my medicine? Keep out of the reach of children. Store at room temperature between 15 and 30 degrees C (59 and 86 degrees F). Keep container tightly closed. Throw away any unused medicine after the expiration date. NOTE: This sheet is a summary. It may not cover all possible information. If you have questions about this medicine, talk to your doctor, pharmacist, or health care  provider.  2018 Elsevier/Gold Standard (2015-08-29 12:05:46)

## 2018-10-26 NOTE — Progress Notes (Signed)
Linda Lam is a 62 y.o. female who presents to [facility] today for right sided back pain. Mrs. Missey has a history of left side sciatica, left sided back pain, and pinched nerve in her neck, for which she is currently going to physical therapy. She noticed the onset of right sided back pain near his SI joint that started in the middle of last week. She says that this pain is different than the left sided pain. She did yoga on Saturday and Sunday. Last night, the pain became much worse. She described it as a shooting pain that takes her breath away. The pain is worse when she takes a deep breath or when she is driving. She took a celebrex this morning which did not relieve the pain.    ROS:  As above  Exam:  BP (!) 172/80   Pulse 72   Ht 5\' 1"  (1.549 m)   Wt 133 lb (60.3 kg)   LMP 02/25/1998   BMI 25.13 kg/m  General: Well Developed, well nourished, and in no acute distress.  Neuro/Psych: Alert and oriented x3, extra-ocular muscles intact, able to move all 4 extremities, sensation grossly intact. Skin: Warm and dry, no rashes noted.  Respiratory: Not using accessory muscles, speaking in full sentences, trachea midline.  Cardiovascular: Pulses palpable, no extremity edema. Abdomen: Does not appear distended. MSK:  Back exam:  Tender to palpation along right SI joint. Nontender spinal midline. Normal flexion and extension without pain.  Limited lateral rotation due to reproduction of pain.  Lower extremity strength is equal normal throughout. Reflexes equal bilaterally.  Antalgic gait present.   Lab and Radiology Results Mr Lumbar Spine Wo Contrast  Result Date: 09/28/2018 CLINICAL DATA:  Left-sided low back pain for the past 4 months. EXAM: MRI LUMBAR SPINE WITHOUT CONTRAST TECHNIQUE: Multiplanar, multisequence MR imaging of the lumbar spine was performed. No intravenous contrast was administered. COMPARISON:  MRI lumbar  spine dated May 27, 2016. FINDINGS: Segmentation:  Standard. Alignment:  Unchanged 5 mm anterolisthesis at L4-L5. Vertebrae:  No fracture, evidence of discitis, or bone lesion. Conus medullaris and cauda equina: Conus extends to the L1-L2 level. Conus and cauda equina appear normal. Paraspinal and other soft tissues: Negative. Disc levels: T11-T12: Negative. T12-L1:  Negative. L1-L2:  Negative. L2-L3: Unchanged small broad-based left subarticular and foraminal disc protrusion. No stenosis. L3-L4: Slightly increased mild diffuse disc bulge and posterior element hypertrophy resulting in new mild central spinal canal stenosis. No neuroforaminal stenosis. L4-L5: Disc uncovering and mild bulging with severe bilateral facet arthropathy. Progressive now severe central spinal canal stenosis. Unchanged mild bilateral neuroforaminal stenosis. L5-S1: Negative disc. Mild bilateral facet arthropathy. No stenosis. IMPRESSION: 1. Progressive now severe central spinal canal stenosis at L4-L5 due to disc bulging and anterolisthesis. Unchanged mild bilateral neuroforaminal stenosis. 2. Slightly increased degenerative changes at L3-L4 with new mild central spinal canal stenosis. Electronically Signed   By: Titus Dubin M.D.   On: 09/28/2018 15:43   I personally (independently) visualized and performed the interpretation of the images attached in this note.   Assessment and Plan: 62 y.o. female with  Right lumbosacral sprain: Discussed with Mrs. Peplinski that this is likely a muscle sprain and spasm. She can continue activity as tolerated. Added lumbosacral PT to the PT she is already receiving for her neck. Use Celebrex as needed as directed by Dr. [name]. Prescribed cyclobenzaprine for bedtime use as needed for muscle spasm. Use heating pad and TENS unit  as needed.     Orders Placed This Encounter  Procedures  . Ambulatory referral to Physical Therapy    Referral Priority:   Routine    Referral Type:   Physical  Medicine    Referral Reason:   Specialty Services Required    Requested Specialty:   Physical Therapy   Meds ordered this encounter  Medications  . cyclobenzaprine (FLEXERIL) 10 MG tablet    Sig: Take 0.5 tablets (5 mg total) by mouth 3 (three) times daily as needed for up to 14 days for muscle spasms.    Dispense:  42 tablet    Refill:  0    Historical information moved to improve visibility of documentation.  Past Medical History:  Diagnosis Date  . Hyperlipidemia    Past Surgical History:  Procedure Laterality Date  . ABDOMINAL HYSTERECTOMY    . BUNIONECTOMY     Social History   Tobacco Use  . Smoking status: Light Tobacco Smoker    Types: Cigarettes  . Smokeless tobacco: Never Used  Substance Use Topics  . Alcohol use: Yes    Alcohol/week: 5.0 standard drinks    Types: 5 Standard drinks or equivalent per week   family history includes Diabetes in her brother, maternal grandmother, and mother; Hypertension in her mother; Stroke in her father.  Medications: Current Outpatient Medications  Medication Sig Dispense Refill  . celecoxib (CELEBREX) 200 MG capsule One to 2 tablets by mouth daily as needed for pain. 60 capsule 2  . cyclobenzaprine (FLEXERIL) 10 MG tablet Take 0.5 tablets (5 mg total) by mouth 3 (three) times daily as needed for up to 14 days for muscle spasms. 42 tablet 0   No current facility-administered medications for this visit.    Allergies  Allergen Reactions  . Demerol [Meperidine] Nausea And Vomiting    This was given via IV push.      Discussed warning signs or symptoms. Please see discharge instructions. Patient expresses understanding.  I personally was present and performed or re-performed the history, physical exam and medical decision-making activities of this service and have verified that the service and findings are accurately documented in the student's note. ___________________________________________ Lynne Leader M.D., ABFM.,  CAQSM. Primary Care and Sports Medicine Adjunct Instructor of [employer]

## 2018-10-27 ENCOUNTER — Ambulatory Visit (INDEPENDENT_AMBULATORY_CARE_PROVIDER_SITE_OTHER): Payer: 59 | Admitting: Physical Therapy

## 2018-10-27 ENCOUNTER — Encounter: Payer: Self-pay | Admitting: Physical Therapy

## 2018-10-27 DIAGNOSIS — R293 Abnormal posture: Secondary | ICD-10-CM

## 2018-10-27 DIAGNOSIS — M545 Low back pain, unspecified: Secondary | ICD-10-CM

## 2018-10-27 DIAGNOSIS — M542 Cervicalgia: Secondary | ICD-10-CM

## 2018-10-27 NOTE — Patient Instructions (Signed)
Access Code: [email or web address]  URL: [email or web address]  Date: 10/27/2018  Prepared by: Faustino Congress   Exercises  Supine Chin Tuck - 10 reps - 1 sets - 5 sec hold - 2x daily - 7x weekly  Seated Scapular Retraction - 10 reps - 1 sets - 5 sec hold - 2x daily - 7x weekly  Standing Backward Shoulder Rolls - 10 reps - 1 sets - 2x daily - 7x weekly  Standing Radial Nerve Glide - 3 reps - 1 sets - 30 sec hold - 1x daily - 7x weekly  Supine Posterior Pelvic Tilt - 10 reps - 1 sets - 5 sec hold - 2x daily - 7x weekly  Seated Flexion Stretch with Swiss Ball - 3 reps - 1 sets - 20 sec hold - 1x daily - 7x weekly  Seated Thoracic Flexion and Rotation with Swiss Ball - 3 reps - 1 sets - 20 sec hold - 1x daily - 7x weekly  Seated Figure 4 Piriformis Stretch - 3 reps - 1 sets - 20 sec hold - 1x daily - 7x weekly  Patient Education  Trigger Point Dry Needling

## 2018-10-27 NOTE — Therapy (Signed)
[place], [place], [address] Phone: [phone]   Fax:  [phone]  Physical Therapy Treatment/Recertification  Patient Details  Name: Linda Lam MRN: [id number] Date of Birth: 04-23-1956 Referring Provider (PT): Silverio Decamp, MD; Lynne Leader, MD   Encounter Date: 10/27/2018  PT End of Session - 10/27/18 1043    Visit Number  5    Number of Visits  12    Date for PT Re-Evaluation  12/08/18    PT Start Time  0850    PT Stop Time  0940    PT Time Calculation (min)  50 min    Activity Tolerance  Patient tolerated treatment well    Behavior During Therapy  [facility] for tasks assessed/performed       Past Medical History:  Diagnosis Date  . Hyperlipidemia     Past Surgical History:  Procedure Laterality Date  . ABDOMINAL HYSTERECTOMY    . BUNIONECTOMY      There were no vitals filed for this visit.  Subjective Assessment - 10/27/18 0853    Subjective  "my back is killing me."  having a lot of back pain that is new, and went to MD yesterday.  feels muscular.     Patient Stated Goals  improve pain and symptoms    Pain Score  8    Pain Location  Back    Pain Orientation  Right    Pain Descriptors / Indicators  Tightness;Spasm    Pain Type  Acute pain    Pain Onset  In the past 7 days    Pain Frequency  Constant    Aggravating Factors   forward bending, twisting and bending    Pain Relieving Factors  muscle relaxer, hot tub, walking         [facility] PT Assessment - 10/27/18 0858      Assessment   Medical Diagnosis  M54.12 (ICD-10-CM) - Radiculitis of left cervical region; LBP    Referring Provider (PT)  Silverio Decamp, MD; Lynne Leader, MD      Observation/Other Assessments   Observations  in stance limited weight bearing to RLE      Posture/Postural Control   Posture/Postural Control  Postural limitations    Postural Limitations  Rounded Shoulders;Forward head       AROM   AROM Assessment Site  Lumbar    Lumbar Flexion  limited 25% with pain    Lumbar Extension  WNL    Lumbar - Right Side Bend  limited 25% with pain on Rt    Lumbar - Left Side Bend  limited 25% with pain on right    Lumbar - Right Rotation  WNL    Lumbar - Left Rotation  WNL      Palpation   Palpation comment  tenderness and trigger points noted in glute med and QL                   [facility] Adult PT Treatment/Exercise - 10/27/18 0858      Exercises   Exercises  Neck;Lumbar      Lumbar Exercises: Stretches   Quadruped Mid Back Stretch  2 reps;30 seconds    Quadruped Mid Back Stretch Limitations  seated with physioball; mid/Lt    Figure 4 Stretch  3 reps;30 seconds;Seated;With overpressure      Lumbar Exercises: Supine   Pelvic Tilt  5 reps;5 seconds      Moist Heat Therapy  Number Minutes Moist Heat  15 Minutes    Moist Heat Location  Lumbar Spine      Electrical Stimulation   Electrical Stimulation Location  Rt low back    Electrical Stimulation Action  IFC    Electrical Stimulation Parameters  to tolerance    Electrical Stimulation Goals  Pain             PT Education - 10/27/18 1043    Education Details  lumbar HEP    Person(s) Educated  Patient    Methods  Explanation    Comprehension  Verbalized understanding          PT Long Term Goals - 10/27/18 1043      PT LONG TERM GOAL #1   Title  independent with HEP    Status  On-going    Target Date  12/08/18      PT LONG TERM GOAL #2   Title  perform cervical ROM without radicular symptoms for improved function    Status  On-going    Target Date  12/08/18      PT LONG TERM GOAL #3   Title  report centralization of symptoms for improved function    Status  On-going    Target Date  12/08/18      PT LONG TERM GOAL #4   Title  report back pain < 4/10 with activity in order to return to regluar exercise    Status  New    Target Date  12/08/18      PT LONG TERM GOAL #5   Title   perform bed mobility with pain < 5/10 for improved function    Status  New    Target Date  12/08/18            Plan - 10/27/18 1045    Clinical Impression Statement  Pt arrives today with new onset of Rt sided LBP which began approx 1 week ago with unknown mechanism of injury.  Pt demonstrates decreased ROM and pain with transitional movements making functional mobility difficult.  Pt will benefit from PT to address LBP as well as continued PT for neck pain.    Rehab Potential  Good    PT Frequency  2x / week    PT Duration  6 weeks    PT Treatment/Interventions  ADLs/Self Care Home Management;Cryotherapy;Electrical Stimulation;Iontophoresis 4mg /ml Dexamethasone;Moist Heat;Traction;Ultrasound;Therapeutic exercise;Therapeutic activities;Patient/family education;Manual techniques;Passive range of motion;Dry needling;Taping    PT Next Visit Plan  assess response to tape and IASTM.  continue postural strengthening and adjust HEP as needed.     PT Home Exercise Plan  switch doorway stretch to supine prolonged shoulder abdct; hold kickboxing class and overhead lifting for body pump. Access Code: [email or web address]    Consulted and Agree with Plan of Care  Patient       Patient will benefit from skilled therapeutic intervention in order to improve the following deficits and impairments:  Pain, Postural dysfunction, Increased fascial restricitons, Increased muscle spasms, Decreased range of motion, Impaired UE functional use  Visit Diagnosis: Cervicalgia - Plan: PT plan of care cert/re-cert  Abnormal posture - Plan: PT plan of care cert/re-cert  Acute right-sided low back pain without sciatica - Plan: PT plan of care cert/re-cert     Problem List Patient Active Problem List   Diagnosis Date Noted  . Radiculitis of left cervical region 09/11/2018  . S/P left bunionectomy 01/20/2018  . Positive colorectal cancer screening using DNA-based stool test 02/24/2017  . Left  lumbar radiculopathy  04/18/2016  . Annual physical exam 06/27/2015  . Primary osteoarthritis of right knee 06/27/2015  . Primary osteoarthritis of left hip 06/27/2015  . Trace mitral regurgitation by prior echocardiogram 02/25/2013  . Tobacco use disorder 02/25/2013  . S/P hysterectomy 02/25/2013  . Fibrocystic breast disease 02/25/2013  . Eczema 02/25/2013  . Skin cancer, basal cell 02/25/2013  . Sinus bradycardia by electrocardiogram 02/25/2013      Laureen Abrahams, PT, DPT 10/27/18 10:56 AM     [facility] [place], [place], [address] Phone: [phone]   Fax:  [phone]  Name: Yanette Tripoli MRN: [id number] Date of Birth: Apr 17, 1956

## 2018-11-03 ENCOUNTER — Encounter: Payer: Self-pay | Admitting: Physical Therapy

## 2018-11-03 ENCOUNTER — Ambulatory Visit (INDEPENDENT_AMBULATORY_CARE_PROVIDER_SITE_OTHER): Payer: 59 | Admitting: Physical Therapy

## 2018-11-03 DIAGNOSIS — M545 Low back pain, unspecified: Secondary | ICD-10-CM

## 2018-11-03 DIAGNOSIS — M542 Cervicalgia: Secondary | ICD-10-CM | POA: Diagnosis not present

## 2018-11-03 DIAGNOSIS — R293 Abnormal posture: Secondary | ICD-10-CM

## 2018-11-03 NOTE — Therapy (Signed)
[place], [place], [address] Phone: [phone]   Fax:  [phone]  Physical Therapy Treatment  Patient Details  Name: Linda Lam MRN: [id number] Date of Birth: 04/07/1956 Referring Provider (PT): Silverio Decamp, MD; Lynne Leader, MD   Encounter Date: 11/03/2018  PT End of Session - 11/03/18 0926    Visit Number  6    Number of Visits  12    Date for PT Re-Evaluation  12/08/18    PT Start Time  0848    PT Stop Time  0936    PT Time Calculation (min)  48 min    Activity Tolerance  Patient tolerated treatment well    Behavior During Therapy  [facility] for tasks assessed/performed       Past Medical History:  Diagnosis Date  . Hyperlipidemia     Past Surgical History:  Procedure Laterality Date  . ABDOMINAL HYSTERECTOMY    . BUNIONECTOMY      There were no vitals filed for this visit.  Subjective Assessment - 11/03/18 0849    Subjective  still taking muscle relaxers.  tried not to take a muscle relaxer but then had trouble getting out of bed.  overall doing much better though.    Patient Stated Goals  improve pain and symptoms    Currently in Pain?  No/denies                       [facility] Adult PT Treatment/Exercise - 11/03/18 0854      Neck Exercises: Supine   Neck Retraction  20 reps;5 secs      Moist Heat Therapy   Number Minutes Moist Heat  15 Minutes    Moist Heat Location  Cervical;Lumbar Spine      Electrical Stimulation   Electrical Stimulation Location  neck and upper traps    Electrical Stimulation Action  IFC    Electrical Stimulation Parameters  to tolerance    Electrical Stimulation Goals  Pain      Manual Therapy   Manual Therapy  Soft tissue mobilization;Manual Traction    Manual therapy comments  skilled palpation and monitoring of soft tissue during DN    Soft tissue mobilization  Lt cervical paraspinals and UT, suboccipital release    Manual Traction  manual traction 20-30 sec 4-5 times through the manual treatment        Trigger Point Dry Needling - 11/03/18 0923    Consent Given?  Yes    Education Handout Provided  Yes    Muscles Treated Upper Body  Longissimus    Longissimus Response  Twitch response elicited;Palpable increased muscle length   Lt cervical paraspinals               PT Long Term Goals - 10/27/18 1043      PT LONG TERM GOAL #1   Title  independent with HEP    Status  On-going    Target Date  12/08/18      PT LONG TERM GOAL #2   Title  perform cervical ROM without radicular symptoms for improved function    Status  On-going    Target Date  12/08/18      PT LONG TERM GOAL #3   Title  report centralization of symptoms for improved function    Status  On-going    Target Date  12/08/18      PT LONG TERM GOAL #4  Title  report back pain < 4/10 with activity in order to return to regluar exercise    Status  New    Target Date  12/08/18      PT LONG TERM GOAL #5   Title  perform bed mobility with pain < 5/10 for improved function    Status  New    Target Date  12/08/18            Plan - 11/03/18 0926    Clinical Impression Statement  Pt reports low back pain is greatly improved and today is more concerned about continued symptoms in neck and LUE.  Recommended DN and pt agreeable today.  Upon completion of DN and manual therapy radicular symptoms resolved with cervical rotation and retraction.  Recommend continued HEP to ensure that pt's symptoms continue to be improved.     Rehab Potential  Good    PT Frequency  2x / week    PT Duration  6 weeks    PT Treatment/Interventions  ADLs/Self Care Home Management;Cryotherapy;Electrical Stimulation;Iontophoresis 4mg /ml Dexamethasone;Moist Heat;Traction;Ultrasound;Therapeutic exercise;Therapeutic activities;Patient/family education;Manual techniques;Passive range of motion;Dry needling;Taping    PT Next Visit Plan  assess response to  tape and IASTM.  continue postural strengthening and adjust HEP as needed.     PT Home Exercise Plan  switch doorway stretch to supine prolonged shoulder abdct; hold kickboxing class and overhead lifting for body pump. Access Code: [email or web address]    Consulted and Agree with Plan of Care  Patient       Patient will benefit from skilled therapeutic intervention in order to improve the following deficits and impairments:  Pain, Postural dysfunction, Increased fascial restricitons, Increased muscle spasms, Decreased range of motion, Impaired UE functional use  Visit Diagnosis: Cervicalgia  Abnormal posture  Acute right-sided low back pain without sciatica     Problem List Patient Active Problem List   Diagnosis Date Noted  . Radiculitis of left cervical region 09/11/2018  . S/P left bunionectomy 01/20/2018  . Positive colorectal cancer screening using DNA-based stool test 02/24/2017  . Left lumbar radiculopathy 04/18/2016  . Annual physical exam 06/27/2015  . Primary osteoarthritis of right knee 06/27/2015  . Primary osteoarthritis of left hip 06/27/2015  . Trace mitral regurgitation by prior echocardiogram 02/25/2013  . Tobacco use disorder 02/25/2013  . S/P hysterectomy 02/25/2013  . Fibrocystic breast disease 02/25/2013  . Eczema 02/25/2013  . Skin cancer, basal cell 02/25/2013  . Sinus bradycardia by electrocardiogram 02/25/2013      Laureen Abrahams, PT, DPT 11/03/18 9:32 AM    [facility] [place], [place], [address] Phone: [phone]   Fax:  [phone]  Name: Linda Lam MRN: [id number] Date of Birth: Sep 06, 1956

## 2018-11-06 ENCOUNTER — Ambulatory Visit (INDEPENDENT_AMBULATORY_CARE_PROVIDER_SITE_OTHER): Payer: 59 | Admitting: Physical Therapy

## 2018-11-06 ENCOUNTER — Encounter: Payer: Self-pay | Admitting: Physical Therapy

## 2018-11-06 ENCOUNTER — Telehealth: Payer: Self-pay

## 2018-11-06 DIAGNOSIS — R293 Abnormal posture: Secondary | ICD-10-CM

## 2018-11-06 DIAGNOSIS — M542 Cervicalgia: Secondary | ICD-10-CM

## 2018-11-06 DIAGNOSIS — M545 Low back pain, unspecified: Secondary | ICD-10-CM

## 2018-11-06 DIAGNOSIS — M5412 Radiculopathy, cervical region: Secondary | ICD-10-CM

## 2018-11-06 NOTE — Telephone Encounter (Signed)
MRI truck is full for this coming Monday unless provider wants to use emergency slot.

## 2018-11-06 NOTE — Therapy (Addendum)
[place], [place], [address] Phone: [phone]   Fax:  [phone]  Physical Therapy Treatment/Discharge  Patient Details  Name: Linda Lam MRN: [id number] Date of Birth: [birth year] Referring Provider (PT): Silverio Decamp, MD; Lynne Leader, MD   Encounter Date: 11/06/2018  PT End of Session - 11/06/18 1117    Visit Number  7    Number of Visits  12    Date for PT Re-Evaluation  12/08/18    PT Start Time  0850    PT Stop Time  0928    PT Time Calculation (min)  38 min    Activity Tolerance  Patient tolerated treatment well    Behavior During Therapy  [facility] for tasks assessed/performed       Past Medical History:  Diagnosis Date  . Hyperlipidemia     Past Surgical History:  Procedure Laterality Date  . ABDOMINAL HYSTERECTOMY    . BUNIONECTOMY      There were no vitals filed for this visit.  Subjective Assessment - 11/06/18 0850    Subjective  had relief for about 1.5 hours after last session, but then it returned "with a vengence."  symptoms are still the same.    Patient Stated Goals  improve pain and symptoms    Currently in Pain?  No/denies                       [facility] Adult PT Treatment/Exercise - 11/06/18 0929      Self-Care   Other Self-Care Comments   long discussion with pt about current symptoms and progress to date.  overall low back feels much improved and she's returned to baseline with this.  Neck and LUE pain is still present and unchanged at this point.  reviewed c-spine anatomy and recommended next steps by MD note.  Pt requesting to hold PT at this time and follow up with MD regarding next possible steps.  Pt with minimal compliance with HEP and advised this would be important to maximize benefits.  educated on TENS application to Rt neck/upper extremity      Neck Exercises: Seated   Shoulder Rolls  Backwards;5 reps    Other Seated Exercise   scapular retraction x 5 reps- freq cues on head position;  W's and L's x 5 reps each     Other Seated Exercise  median nerve glide x 3 reps      Neck Exercises: Supine   Neck Retraction  20 reps;5 secs             PT Education - 11/06/18 1117    Education Details  see self care    Person(s) Educated  Patient    Methods  Explanation          PT Long Term Goals - 11/06/18 1118      PT LONG TERM GOAL #1   Title  independent with HEP    Status  Achieved      PT LONG TERM GOAL #2   Title  perform cervical ROM without radicular symptoms for improved function    Baseline  12/6: not met to date    Status  Not Met    Target Date  12/08/18      PT LONG TERM GOAL #3   Title  report centralization of symptoms for improved function    Baseline  12/6: not met to date    Status  Not  Met    Target Date  12/08/18      PT LONG TERM GOAL #4   Title  report back pain < 4/10 with activity in order to return to regluar exercise    Status  Achieved      PT LONG TERM GOAL #5   Title  perform bed mobility with pain < 5/10 for improved function    Status  Achieved            Plan - 11/06/18 1119    Clinical Impression Statement  Pt has met 3 LTGs but continues to reports neck and LUE symptoms unchanged since initial eval.  At this time recommend holding PT and pt to follow up with MD regarding additional recommendations.  Will plan to hold PT and follow up PRN based on MD recs.    Rehab Potential  Good    PT Frequency  2x / week    PT Duration  6 weeks    PT Treatment/Interventions  ADLs/Self Care Home Management;Cryotherapy;Electrical Stimulation;Iontophoresis 17m/ml Dexamethasone;Moist Heat;Traction;Ultrasound;Therapeutic exercise;Therapeutic activities;Patient/family education;Manual techniques;Passive range of motion;Dry needling;Taping    PT Next Visit Plan  hold PT, pt to return to MD    PT Home Exercise Plan  switch doorway stretch to supine prolonged shoulder abdct; hold  kickboxing class and overhead lifting for body pump. Access Code: [email or web address]   Consulted and Agree with Plan of Care  Patient       Patient will benefit from skilled therapeutic intervention in order to improve the following deficits and impairments:  Pain, Postural dysfunction, Increased fascial restricitons, Increased muscle spasms, Decreased range of motion, Impaired UE functional use  Visit Diagnosis: Cervicalgia  Abnormal posture  Acute right-sided low back pain without sciatica     Problem List Patient Active Problem List   Diagnosis Date Noted  . Radiculitis of left cervical region 09/11/2018  . S/P left bunionectomy 01/20/2018  . Positive colorectal cancer screening using DNA-based stool test 02/24/2017  . Left lumbar radiculopathy 04/18/2016  . Annual physical exam 06/27/2015  . Primary osteoarthritis of right knee 06/27/2015  . Primary osteoarthritis of left hip 06/27/2015  . Trace mitral regurgitation by prior echocardiogram 02/25/2013  . Tobacco use disorder 02/25/2013  . S/P hysterectomy 02/25/2013  . Fibrocystic breast disease 02/25/2013  . Eczema 02/25/2013  . Skin cancer, basal cell 02/25/2013  . Sinus bradycardia by electrocardiogram 02/25/2013      SLaureen Abrahams PT, DPT 11/06/18 11:26 AM    [facility] [address] [address]Phone: [phone]  Fax:  [phone] Name: Linda GrinageMRN: [id number] of Birth: [birth year]     PHYSICAL THERAPY DISCHARGE SUMMARY  Visits from Start of Care: 7  Current functional level related to goals / functional outcomes: See above   Remaining deficits: See above   Education / Equipment: HEP  Plan: Patient agrees to discharge.  Patient goals were not met. Patient is being discharged due to lack of progress.  ?????    Pt had MRI and referred to surgery for ACDF consideration.  SLaureen Abrahams PT,  DPT 11/30/18 12:59 PM  [facility] [place] [address] [phone](office) [phone](fax)

## 2018-11-06 NOTE — Telephone Encounter (Signed)
MRI approved.   Will get this done at [place] tomorrow since she wants it done urgently.

## 2018-11-06 NOTE — Telephone Encounter (Signed)
[place]! [place]! [place]! [place]! [place]! [place]!

## 2018-11-06 NOTE — Telephone Encounter (Signed)
If she is referring to the cervical spine MRI I am happy to order it, but I do not think we should use the emergency slot.

## 2018-11-06 NOTE — Telephone Encounter (Signed)
Patient states that PT isn't working and she would like an MRI on Monday. She stated that PT also sent a note in regards to this.

## 2018-11-07 ENCOUNTER — Ambulatory Visit (HOSPITAL_BASED_OUTPATIENT_CLINIC_OR_DEPARTMENT_OTHER)
Admission: RE | Admit: 2018-11-07 | Discharge: 2018-11-07 | Disposition: A | Discharge status: Home / Self Care | Payer: 59 | Source: Ambulatory Visit | Attending: Sports Medicine | Admitting: Sports Medicine

## 2018-11-07 DIAGNOSIS — M50121 Cervical disc disorder at C4-C5 level with radiculopathy: Secondary | ICD-10-CM | POA: Diagnosis not present

## 2018-11-07 DIAGNOSIS — M4802 Spinal stenosis, cervical region: Secondary | ICD-10-CM | POA: Diagnosis not present

## 2018-11-07 DIAGNOSIS — M4722 Other spondylosis with radiculopathy, cervical region: Secondary | ICD-10-CM | POA: Diagnosis not present

## 2018-11-07 DIAGNOSIS — M5412 Radiculopathy, cervical region: Secondary | ICD-10-CM | POA: Diagnosis present

## 2018-11-08 NOTE — Addendum Note (Signed)
Addended by: Silverio Decamp on: 11/08/2018 09:51 AM   Modules accepted: Orders

## 2018-12-10 DIAGNOSIS — Z85828 Personal history of other malignant neoplasm of skin: Secondary | ICD-10-CM | POA: Diagnosis not present

## 2018-12-10 DIAGNOSIS — D225 Melanocytic nevi of trunk: Secondary | ICD-10-CM | POA: Diagnosis not present

## 2018-12-10 DIAGNOSIS — D2261 Melanocytic nevi of right upper limb, including shoulder: Secondary | ICD-10-CM | POA: Diagnosis not present

## 2019-07-21 ENCOUNTER — Other Ambulatory Visit: Payer: Self-pay | Admitting: Sports Medicine

## 2019-07-21 DIAGNOSIS — Z1231 Encounter for screening mammogram for malignant neoplasm of breast: Secondary | ICD-10-CM

## 2019-08-25 ENCOUNTER — Other Ambulatory Visit: Payer: Self-pay

## 2019-08-25 ENCOUNTER — Ambulatory Visit (INDEPENDENT_AMBULATORY_CARE_PROVIDER_SITE_OTHER): Payer: 59

## 2019-08-25 ENCOUNTER — Other Ambulatory Visit: Payer: Self-pay | Admitting: Sports Medicine

## 2019-08-25 DIAGNOSIS — Z0000 Encounter for general adult medical examination without abnormal findings: Secondary | ICD-10-CM

## 2019-08-25 DIAGNOSIS — Z1231 Encounter for screening mammogram for malignant neoplasm of breast: Secondary | ICD-10-CM | POA: Diagnosis not present

## 2019-08-26 ENCOUNTER — Telehealth: Payer: Self-pay | Admitting: *Deleted

## 2019-08-26 ENCOUNTER — Other Ambulatory Visit: Payer: Self-pay | Admitting: Sports Medicine

## 2019-08-26 DIAGNOSIS — R928 Other abnormal and inconclusive findings on diagnostic imaging of breast: Secondary | ICD-10-CM

## 2019-08-26 NOTE — Telephone Encounter (Signed)
Pt left vm wanting to know what the changes were from last year's mammo to this year's since more testing is needed.  I returned pt's call but had to leave her a vm.

## 2019-08-27 NOTE — Telephone Encounter (Signed)
Left VM for Pt to return clinic call.  

## 2019-08-27 NOTE — Telephone Encounter (Signed)
There is an asymmetry seen in the left breast that was not seen on the previous mammogram, this is why.

## 2019-08-31 ENCOUNTER — Ambulatory Visit: Payer: 59

## 2019-08-31 ENCOUNTER — Other Ambulatory Visit: Payer: Self-pay

## 2019-08-31 ENCOUNTER — Ambulatory Visit
Admission: RE | Admit: 2019-08-31 | Discharge: 2019-08-31 | Disposition: A | Discharge status: Home / Self Care | Payer: 59 | Source: Ambulatory Visit | Attending: Sports Medicine | Admitting: Sports Medicine

## 2019-08-31 DIAGNOSIS — R928 Other abnormal and inconclusive findings on diagnostic imaging of breast: Secondary | ICD-10-CM

## 2019-09-27 ENCOUNTER — Ambulatory Visit (INDEPENDENT_AMBULATORY_CARE_PROVIDER_SITE_OTHER): Payer: 59 | Admitting: Sports Medicine

## 2019-09-27 ENCOUNTER — Other Ambulatory Visit: Payer: Self-pay

## 2019-09-27 ENCOUNTER — Encounter: Payer: Self-pay | Admitting: Sports Medicine

## 2019-09-27 DIAGNOSIS — H00019 Hordeolum externum unspecified eye, unspecified eyelid: Secondary | ICD-10-CM | POA: Insufficient documentation

## 2019-09-27 DIAGNOSIS — H00012 Hordeolum externum right lower eyelid: Secondary | ICD-10-CM | POA: Diagnosis not present

## 2019-09-27 DIAGNOSIS — Z0000 Encounter for general adult medical examination without abnormal findings: Secondary | ICD-10-CM | POA: Diagnosis not present

## 2019-09-27 MED ORDER — DOXYCYCLINE HYCLATE 100 MG PO TABS: 100.0000 mg | ORAL_TABLET | Freq: Two times a day (BID) | ORAL | 0 refills | Status: AC

## 2019-09-27 MED ORDER — ERYTHROMYCIN 5 MG/GM OP OINT: 1.0000 "application " | TOPICAL_OINTMENT | Freq: Three times a day (TID) | OPHTHALMIC | 0 refills | Status: DC

## 2019-09-27 NOTE — Patient Instructions (Signed)

## 2019-09-27 NOTE — Progress Notes (Signed)
Subjective:    CC: Inflammation of the R eye lid  HPI: Linda Lam is a pleasant 64 year old presenting today with one week localized inflammation of her R lower eye lid. She has experienced symptoms on her L eye which lasted ~2 weeks and resolved one week ago. She has been using warm compresses over the eye which have been helpful. She has not experienced any changes in vision, or drainage from the eye.  I reviewed the past medical history, family history, social history, surgical history, and allergies today and no changes were needed.  Please see the problem list section below in epic for further details.  Past Medical History: Past Medical History:  Diagnosis Date  . Hyperlipidemia    Past Surgical History: Past Surgical History:  Procedure Laterality Date  . ABDOMINAL HYSTERECTOMY    . BUNIONECTOMY     Social History: Social History   Socioeconomic History  . Marital status: Married    Spouse name: Not on file  . Number of children: Not on file  . Years of education: Not on file  . Highest education level: Not on file  Occupational History  . Not on file  Social Needs  . Financial resource strain: Not on file  . Food insecurity    Worry: Not on file    Inability: Not on file  . Transportation needs    Medical: Not on file    Non-medical: Not on file  Tobacco Use  . Smoking status: Light Tobacco Smoker    Types: Cigarettes  . Smokeless tobacco: Never Used  Substance and Sexual Activity  . Alcohol use: Yes    Alcohol/week: 5.0 standard drinks    Types: 5 Standard drinks or equivalent per week  . Drug use: No  . Sexual activity: Yes    Partners: Male    Birth control/protection: Surgical  Lifestyle  . Physical activity    Days per week: Not on file    Minutes per session: Not on file  . Stress: Not on file  Relationships  . Social Herbalist on phone: Not on file    Gets together: Not on file    Attends religious service: Not on file    Active  member of club or organization: Not on file    Attends meetings of clubs or organizations: Not on file    Relationship status: Not on file  Other Topics Concern  . Not on file  Social History Narrative  . Not on file   Family History: Family History  Problem Relation Age of Onset  . Hypertension Mother   . Diabetes Mother   . Stroke Father   . Diabetes Brother   . Diabetes Maternal Grandmother    Allergies: Allergies  Allergen Reactions  . Demerol [Meperidine] Nausea And Vomiting    This was given via IV push.   Medications: See med rec.  Review of Systems: No fevers, chills, night sweats, weight loss, chest pain, or shortness of breath.   Objective:    General: Well Developed, well nourished, and in no acute distress.  Neuro: Alert and oriented x3, extra-ocular muscles intact, sensation grossly intact.  HEENT: Normocephalic, atraumatic, pupils equal round reactive to light.  Skin: tense erythema and edema ~1 cm on the lower R eye lid with some fluid accumulation in the inferior periorbital region. Cardiac: Regular rate and rhythm, no lower extremity edema.  Respiratory: Clear to auscultation bilaterally. Not using accessory muscles, speaking in full sentences.  A/P: Linda Lam presented today with 1 week inflammation on her lower R eye lid following a similar lesion on her L eye lid which lasted 2 weeks and resolved 1 week ago. The inflammation presented acutely and has not improved. Given the appearance of her eye lid and the onset this is likely a stye possibly due to S. Aureus. She has been given topical erythromycin and oral doxycycline with instructions to continue compresses and follow-up if not improving.   Impression and Recommendations:    Stye Right lower, topical erythromycin, oral doxycycline. Follow-up as needed for this.   ___________________________________________ Gwen Her. [name], M.D., ABFM., CAQSM. Primary Care and Sports Medicine [place]  [facility]  Adjunct Professor of [employer]

## 2019-09-27 NOTE — Assessment & Plan Note (Signed)
Right lower, topical erythromycin, oral doxycycline. Follow-up as needed for this.

## 2019-10-24 ENCOUNTER — Encounter: Payer: Self-pay | Admitting: Sports Medicine

## 2019-10-24 DIAGNOSIS — J0101 Acute recurrent maxillary sinusitis: Secondary | ICD-10-CM

## 2019-10-25 MED ORDER — PREDNISONE 50 MG PO TABS: ORAL_TABLET | ORAL | 0 refills | Status: DC

## 2019-10-25 MED ORDER — BENZONATATE 200 MG PO CAPS: 200.0000 mg | ORAL_CAPSULE | Freq: Three times a day (TID) | ORAL | 0 refills | Status: DC | PRN

## 2019-11-01 NOTE — Telephone Encounter (Signed)
Lets start with a virtual visit.

## 2019-11-03 ENCOUNTER — Ambulatory Visit (INDEPENDENT_AMBULATORY_CARE_PROVIDER_SITE_OTHER): Payer: 59 | Admitting: Sports Medicine

## 2019-11-03 ENCOUNTER — Other Ambulatory Visit: Payer: Self-pay

## 2019-11-03 DIAGNOSIS — M5416 Radiculopathy, lumbar region: Secondary | ICD-10-CM | POA: Diagnosis not present

## 2019-11-03 DIAGNOSIS — J0100 Acute maxillary sinusitis, unspecified: Secondary | ICD-10-CM | POA: Insufficient documentation

## 2019-11-03 DIAGNOSIS — J0101 Acute recurrent maxillary sinusitis: Secondary | ICD-10-CM

## 2019-11-03 MED ORDER — FLUCONAZOLE 150 MG PO TABS: 150.0000 mg | ORAL_TABLET | Freq: Once | ORAL | 0 refills | Status: AC

## 2019-11-03 MED ORDER — AMOXICILLIN-POT CLAVULANATE 875-125 MG PO TABS: 1.0000 | ORAL_TABLET | Freq: Two times a day (BID) | ORAL | 0 refills | Status: AC

## 2019-11-03 NOTE — Progress Notes (Signed)
Virtual Visit via WebEx/MyChart   I connected with  Linda Lam  on 11/03/19 via WebEx/MyChart/Doximity Video and verified that I am speaking with the correct person using two identifiers.   I discussed the limitations, risks, security and privacy concerns of performing an evaluation and management service by WebEx/MyChart/Doximity Video, including the higher likelihood of inaccurate diagnosis and treatment, and the availability of in person appointments.  We also discussed the likely need of an additional face to face encounter for complete and high quality delivery of care.  I also discussed with the patient that there may be a patient responsible charge related to this service. The patient expressed understanding and wishes to proceed.  Provider location is either at home or medical facility. Patient location is at their home, different from provider location. People involved in care of the patient during this telehealth encounter were myself, my nurse/medical assistant, and my front office/scheduling team member.  Subjective:    CC: Facial pressure  HPI: See assessment and plan for further details.  I reviewed the past medical history, family history, social history, surgical history, and allergies today and no changes were needed.  Please see the problem list section below in epic for further details.  Past Medical History: Past Medical History:  Diagnosis Date  . Hyperlipidemia    Past Surgical History: Past Surgical History:  Procedure Laterality Date  . ABDOMINAL HYSTERECTOMY    . BUNIONECTOMY     Social History: Social History   Socioeconomic History  . Marital status: Married    Spouse name: Not on file  . Number of children: Not on file  . Years of education: Not on file  . Highest education level: Not on file  Occupational History  . Not on file  Social Needs  . Financial resource strain: Not on file  . Food insecurity    Worry: Not on file   Inability: Not on file  . Transportation needs    Medical: Not on file    Non-medical: Not on file  Tobacco Use  . Smoking status: Light Tobacco Smoker    Types: Cigarettes  . Smokeless tobacco: Never Used  Substance and Sexual Activity  . Alcohol use: Yes    Alcohol/week: 5.0 standard drinks    Types: 5 Standard drinks or equivalent per week  . Drug use: No  . Sexual activity: Yes    Partners: Male    Birth control/protection: Surgical  Lifestyle  . Physical activity    Days per week: Not on file    Minutes per session: Not on file  . Stress: Not on file  Relationships  . Social Herbalist on phone: Not on file    Gets together: Not on file    Attends religious service: Not on file    Active member of club or organization: Not on file    Attends meetings of clubs or organizations: Not on file    Relationship status: Not on file  Other Topics Concern  . Not on file  Social History Narrative  . Not on file   Family History: Family History  Problem Relation Age of Onset  . Hypertension Mother   . Diabetes Mother   . Stroke Father   . Diabetes Brother   . Diabetes Maternal Grandmother    Allergies: Allergies  Allergen Reactions  . Demerol [Meperidine] Nausea And Vomiting    This was given via IV push.   Medications: See med rec.  Review of Systems: No fevers, chills, night sweats, weight loss, chest pain, or shortness of breath.   Objective:    General: Speaking full sentences, no audible heavy breathing.  Sounds alert and appropriately interactive.  Appears well.  Face symmetric.  Extraocular movements intact.  Pupils equal and round.  No nasal flaring or accessory muscle use visualized.  No other physical exam performed due to the non-physical nature of this visit.  Impression and Recommendations:    No problem-specific Assessment & Plan notes found for this encounter.   I discussed the above assessment and treatment plan with the patient. The  patient was provided an opportunity to ask questions and all were answered. The patient agreed with the plan and demonstrated an understanding of the instructions.   The patient was advised to call back or seek an in-person evaluation if the symptoms worsen or if the condition fails to improve as anticipated.   I provided 25 minutes of non-face-to-face time during this encounter, 15 minutes of additional time was needed to gather information, review chart, records, communicate/coordinate with staff remotely, troubleshooting the multiple errors that we get every time when trying to do video calls through the electronic medical record, WebEx, and Doximity, restart the encounter multiple times due to instability of the software, as well as complete documentation.   ___________________________________________ Gwen Her. [name], M.D., ABFM., CAQSM. Primary Care and Sports Medicine [facility]  Adjunct Professor of [employer] [facility]

## 2019-11-03 NOTE — Assessment & Plan Note (Signed)
Right-sided, she will use Sudafed, Augmentin, adding some Diflucan just in case. If persistent symptoms in a couple weeks we will likely CT scan her sinuses.

## 2019-11-03 NOTE — Assessment & Plan Note (Signed)
Severe central canal stenosis at L4-L5, she responded extremely well to a left L4-L5 interlaminar, and transforaminal epidurals a year ago. Persistent discomfort, ordering repeat injections. This time on the right at L4-L5, interlaminar.

## 2019-11-03 NOTE — Telephone Encounter (Signed)
Patient scheduled.

## 2019-11-04 ENCOUNTER — Ambulatory Visit: Payer: 59 | Admitting: Sports Medicine

## 2019-11-09 NOTE — Addendum Note (Signed)
Addended by: Silverio Decamp on: 11/09/2019 11:55 AM   Modules accepted: Orders

## 2019-11-10 ENCOUNTER — Other Ambulatory Visit: Payer: Self-pay

## 2019-11-10 ENCOUNTER — Ambulatory Visit (INDEPENDENT_AMBULATORY_CARE_PROVIDER_SITE_OTHER): Payer: 59

## 2019-11-10 DIAGNOSIS — J0101 Acute recurrent maxillary sinusitis: Secondary | ICD-10-CM | POA: Diagnosis not present

## 2019-11-11 NOTE — Telephone Encounter (Signed)
We are not doing this over my chart anymore, bring her in.

## 2019-11-12 ENCOUNTER — Ambulatory Visit (INDEPENDENT_AMBULATORY_CARE_PROVIDER_SITE_OTHER): Payer: 59 | Admitting: Physician Assistant

## 2019-11-12 ENCOUNTER — Encounter: Payer: Self-pay | Admitting: Physician Assistant

## 2019-11-12 ENCOUNTER — Other Ambulatory Visit: Payer: Self-pay

## 2019-11-12 VITALS — BP 135/86 | HR 66 | Ht 61.0 in | Wt 135.0 lb

## 2019-11-12 DIAGNOSIS — H9191 Unspecified hearing loss, right ear: Secondary | ICD-10-CM

## 2019-11-12 DIAGNOSIS — H6981 Other specified disorders of Eustachian tube, right ear: Secondary | ICD-10-CM | POA: Diagnosis not present

## 2019-11-12 MED ORDER — METHYLPREDNISOLONE 4 MG PO TBPK: ORAL_TABLET | ORAL | 0 refills | Status: DC

## 2019-11-12 MED ORDER — FLUTICASONE PROPIONATE 50 MCG/ACT NA SUSP: 2.0000 | Freq: Every day | NASAL | 1 refills | Status: DC

## 2019-11-12 NOTE — Progress Notes (Signed)
Subjective:    Patient ID: Linda Lam, female    DOB: Nov 22, 1956, 63 y.o.   MRN: [id number]  HPI  Patient is a 63 year old female who presents to the clinic with right ear hearing loss and pressure and congestion.  She has recently been treated with 2 rounds of antibiotic for an eye stye and sinusitis.  Dr. Hall Busing ended up ordering a CT scan on 11/10/2019 that was negative for sinusitis.  She denies any sinus pressure or nasal congestion.  She denies any fever, chills, cough, sore throat.  She was taking prednisone for her back and the day after she stopped she had sudden hearing loss in her right ear.  She does feel some pressure but no pain.  She has tried yawning and chewing gum with no relief.  She has tried Sudafed a few times with no relief.  She hates nasal sprays and has not tried anything else.  She is very concerned because this is been persistent for 5 days.  She does have to fly over Christmas and concerned about her ears.  She has never had a thing like this before.    .. Active Ambulatory Problems    Diagnosis Date Noted  . Trace mitral regurgitation by prior echocardiogram 02/25/2013  . Tobacco use disorder 02/25/2013  . S/P hysterectomy 02/25/2013  . Fibrocystic breast disease 02/25/2013  . Eczema 02/25/2013  . Skin cancer, basal cell 02/25/2013  . Sinus bradycardia by electrocardiogram 02/25/2013  . Annual physical exam 06/27/2015  . Primary osteoarthritis of right knee 06/27/2015  . Primary osteoarthritis of left hip 06/27/2015  . Left lumbar radiculopathy 04/18/2016  . Positive colorectal cancer screening using DNA-based stool test 02/24/2017  . S/P left bunionectomy 01/20/2018  . Radiculitis of left cervical region 09/11/2018  . Stye 09/27/2019  . Acute maxillary sinusitis 11/03/2019   Resolved Ambulatory Problems    Diagnosis Date Noted  . Right shoulder pain 06/27/2015  . Abnormal weight gain 06/27/2015  . Skin lesion of right leg 08/02/2015  . Left  tennis elbow 10/03/2015  . Preoperative clearance 12/19/2017   Past Medical History:  Diagnosis Date  . Hyperlipidemia    Reviewed med, allergy, problem list.  Review of Systems See HPI.     Objective:   Physical Exam Vitals reviewed.  Constitutional:      Appearance: Normal appearance.  HENT:     Head: Normocephalic and atraumatic.     Right Ear: Ear canal and external ear normal. There is no impacted cerumen.     Left Ear: Tympanic membrane, ear canal and external ear normal. There is no impacted cerumen.     Ears:     Comments: Dull TM on right.     Nose: Nose normal. No congestion.     Mouth/Throat:     Mouth: Mucous membranes are moist.  Eyes:     Extraocular Movements: Extraocular movements intact.     Conjunctiva/sclera: Conjunctivae normal.     Pupils: Pupils are equal, round, and reactive to light.  Cardiovascular:     Rate and Rhythm: Normal rate and regular rhythm.  Pulmonary:     Effort: Pulmonary effort is normal.  Lymphadenopathy:     Cervical: No cervical adenopathy.  Neurological:     General: No focal deficit present.     Mental Status: She is alert and oriented to person, place, and time.  Psychiatric:        Mood and Affect: Mood normal.  Assessment & Plan:  Marland KitchenMarland KitchenRosalie was seen today for ear problem.  Diagnoses and all orders for this visit:  Dysfunction of right eustachian tube -     methylPREDNISolone (MEDROL DOSEPAK) 4 MG TBPK tablet; Take as directed by package insert. -     fluticasone (FLONASE) 50 MCG/ACT nasal spray; Place 2 sprays into both nostrils daily. -     Ambulatory referral to ENT  Hearing loss of right ear, unspecified hearing loss type -     methylPREDNISolone (MEDROL DOSEPAK) 4 MG TBPK tablet; Take as directed by package insert. -     fluticasone (FLONASE) 50 MCG/ACT nasal spray; Place 2 sprays into both nostrils daily. -     Ambulatory referral to ENT   Reassured patient no significant wax accumulation to  constitute hearing loss. No sign of infection.  Her right TM was very dull.  Her symptoms seem like eustachian tube dysfunction.  Handout was given patient.  Patient did not like the ideas of starting Flonase and Afrin.  I did explain how to use nasal sprays.  I did go ahead and send over the Flonase if she would like to try first.  She just got off prednisone but it was a burst.  I decided to send over a taper pack to see if it could help with some eustachian tube dysfunction.  Strongly encouraged chewing gum and working on sucking maneuvers to alleviate the pressure.  Patient is really concerned about her upcoming travel therefore I went ahead and placed a referral to ENT.

## 2019-11-12 NOTE — Patient Instructions (Signed)

## 2019-11-15 ENCOUNTER — Telehealth: Payer: Self-pay | Admitting: Neurology

## 2019-11-15 NOTE — Telephone Encounter (Signed)
Referral was made on Friday. They are supposed to call her with appt time. She can call [facility] first if she would like ok to give number.

## 2019-11-15 NOTE — Telephone Encounter (Signed)
Patient called and states still can't hear in right ear after Prednisone RX. She states she was suppose to let us know and was to get a referral to ENT if no better. Please advise.

## 2019-11-15 NOTE — Telephone Encounter (Signed)
Called patient and LMOM letting her know referral was placed and she can call [phone] to schedule directly with them. To call us with any further questions.

## 2019-11-16 ENCOUNTER — Other Ambulatory Visit: Payer: 59

## 2019-12-01 ENCOUNTER — Telehealth: Payer: Self-pay | Admitting: *Deleted

## 2019-12-01 DIAGNOSIS — M5416 Radiculopathy, lumbar region: Secondary | ICD-10-CM

## 2019-12-01 MED ORDER — CELECOXIB 200 MG PO CAPS: ORAL_CAPSULE | ORAL | 2 refills | Status: DC

## 2019-12-01 NOTE — Telephone Encounter (Signed)
Pt lvm asking about getting a refill for celebrex sent to [facility].   Medication sent.Linda Lam, [place]ahoma Crocker, CMA

## 2019-12-07 ENCOUNTER — Telehealth: Payer: Self-pay

## 2019-12-07 NOTE — Telephone Encounter (Signed)
FYI: Pt called and stated she was wanting to schedule the epidural injection ordered by Dr. [name] on 11/03/2019. Pt was scheduled to have the injection done on 11/16/2019 but cancelled stating she was on prednisone, feeling better, and would call back and reschedule the epidural injection. Pt is ready for the injection now. I contacted Roberta Chrismon and asked her to contact the pt for scheduling and she said she would contact her today.

## 2019-12-09 ENCOUNTER — Other Ambulatory Visit: Payer: Self-pay

## 2019-12-09 ENCOUNTER — Ambulatory Visit
Admission: RE | Admit: 2019-12-09 | Discharge: 2019-12-09 | Disposition: A | Discharge status: Home / Self Care | Payer: 59 | Source: Ambulatory Visit | Attending: Sports Medicine | Admitting: Sports Medicine

## 2019-12-09 MED ORDER — METHYLPREDNISOLONE ACETATE 40 MG/ML INJ SUSP (RADIOLOG
120.0000 mg | Freq: Once | INTRAMUSCULAR | Status: AC
  Administered 2019-12-09: 120 mg via EPIDURAL

## 2019-12-09 MED ORDER — IOPAMIDOL (ISOVUE-M 200) INJECTION 41%
1.0000 mL | Freq: Once | INTRAMUSCULAR | Status: AC
  Administered 2019-12-09: 12:00:00 1 mL via EPIDURAL

## 2019-12-09 NOTE — Discharge Instructions (Signed)

## 2020-02-28 ENCOUNTER — Telehealth: Payer: Self-pay | Admitting: *Deleted

## 2020-02-28 DIAGNOSIS — Z0000 Encounter for general adult medical examination without abnormal findings: Secondary | ICD-10-CM

## 2020-02-28 DIAGNOSIS — Z20822 Contact with and (suspected) exposure to covid-19: Secondary | ICD-10-CM

## 2020-02-28 NOTE — Telephone Encounter (Signed)
Pt left vm wanting labs orders for upcoming CPE.  She would like for you to add covid antibodies to her requisition.  Pt plans to go to the lab Wednesday morning.

## 2020-02-28 NOTE — Telephone Encounter (Signed)
Done

## 2020-06-12 LAB — LIPID PANEL W/REFLEX DIRECT LDL
Cholesterol: 215 mg/dL — ABNORMAL HIGH (ref <200)
HDL: 67 mg/dL (ref >=50)
LDL Cholesterol (Calc): 128 mg/dL (calc) — ABNORMAL HIGH
Non-HDL Cholesterol (Calc): 148 mg/dL (calc) — ABNORMAL HIGH (ref <130)
Total CHOL/HDL Ratio: 3.2 (calc) (ref <5.0)
Triglycerides: 94 mg/dL (ref <150)

## 2020-06-12 LAB — COMPLETE METABOLIC PANEL WITH GFR
AG Ratio: 2.1 (calc) (ref 1.0–2.5)
ALT: 11 U/L (ref 6–29)
AST: 17 U/L (ref 10–35)
Albumin: 4.5 g/dL (ref 3.6–5.1)
Alkaline phosphatase (APISO): 90 U/L (ref 37–153)
BUN/Creatinine Ratio: 13 (calc) (ref 6–22)
BUN: 14 mg/dL (ref 7–25)
CO2: 27 mmol/L (ref 20–32)
Calcium: 9.7 mg/dL (ref 8.6–10.4)
Chloride: 105 mmol/L (ref 98–110)
Creat: 1.12 mg/dL — ABNORMAL HIGH (ref 0.50–0.99)
GFR, Est African American: 60 mL/min/{1.73_m2} (ref >=60)
GFR, Est Non African American: 52 mL/min/{1.73_m2} — ABNORMAL LOW (ref >=60)
Globulin: 2.1 g/dL (calc) (ref 1.9–3.7)
Glucose, Bld: 96 mg/dL (ref 65–99)
Potassium: 5 mmol/L (ref 3.5–5.3)
Sodium: 139 mmol/L (ref 135–146)
Total Bilirubin: 0.6 mg/dL (ref 0.2–1.2)
Total Protein: 6.6 g/dL (ref 6.1–8.1)

## 2020-06-12 LAB — CBC
HCT: 44 % (ref 35.0–45.0)
Hemoglobin: 14.4 g/dL (ref 11.7–15.5)
MCH: 27.6 pg (ref 27.0–33.0)
MCHC: 32.7 g/dL (ref 32.0–36.0)
MCV: 84.3 fL (ref 80.0–100.0)
MPV: 11.4 fL (ref 7.5–12.5)
Platelets: 279 10*3/uL (ref 140–400)
RBC: 5.22 10*6/uL — ABNORMAL HIGH (ref 3.80–5.10)
RDW: 12.5 % (ref 11.0–15.0)
WBC: 5.5 10*3/uL (ref 3.8–10.8)

## 2020-06-12 LAB — SARS-COV-2 ANTIBODY(IGG)SPIKE,SEMI-QUANTITATIVE: SARS COV1 AB(IGG)SPIKE,SEMI QN: <1 {index} (ref <1.00)

## 2020-06-12 LAB — TSH: TSH: 1.5 mIU/L (ref 0.40–4.50)

## 2020-06-20 ENCOUNTER — Ambulatory Visit (INDEPENDENT_AMBULATORY_CARE_PROVIDER_SITE_OTHER): Payer: 59 | Admitting: Sports Medicine

## 2020-06-20 ENCOUNTER — Encounter: Payer: Self-pay | Admitting: Sports Medicine

## 2020-06-20 DIAGNOSIS — M170 Bilateral primary osteoarthritis of knee: Secondary | ICD-10-CM | POA: Diagnosis not present

## 2020-06-20 DIAGNOSIS — Z0000 Encounter for general adult medical examination without abnormal findings: Secondary | ICD-10-CM | POA: Diagnosis not present

## 2020-06-20 NOTE — Assessment & Plan Note (Signed)
Today I also provided her with some COVID-19 vaccine counseling, we explained the mechanism of action. She is interested in the vaccine but does not want it in her deltoid, she would prefer it in the gluteus maximus, I told her any muscle is a possible target for intramuscular vaccines,

## 2020-06-20 NOTE — Patient Instructions (Signed)
Fat and Cholesterol Restricted Eating Plan Eating a diet that limits fat and cholesterol may help lower your risk for heart disease and other conditions. Your body needs fat and cholesterol for basic functions, but eating too much of these things can be harmful to your health. Your health care provider may order lab tests to check your blood fat (lipid) and cholesterol levels. This helps your health care provider understand your risk for certain conditions and whether you need to make diet changes. Work with your health care provider or dietitian to make an eating plan that is right for you.  What are tips for following this plan? General guidelines   If you are overweight, work with your health care provider to lose weight safely. Losing just 5-10% of your body weight can improve your overall health and help prevent diseases such as diabetes and heart disease.  Avoid: ? Foods with added sugar. ? Fried foods. ? Foods that contain partially hydrogenated oils, including stick margarine, some tub margarines, cookies, crackers, and other baked goods.  Limit alcohol intake to no more than 1 drink a day for nonpregnant women and 2 drinks a day for men. One drink equals 12 oz of beer, 5 oz of wine, or 1 oz of hard liquor. Reading food labels  Check food labels for: ? Trans fats, partially hydrogenated oils, or high amounts of saturated fat. Avoid foods that contain saturated fat and trans fat. ? The amount of cholesterol in each serving. Try to eat no more than 200 mg of cholesterol each day. ? The amount of fiber in each serving. Try to eat at least 20-30 g of fiber each day.  Choose foods with healthy fats, such as: ? Monounsaturated and polyunsaturated fats. These include olive and canola oil, flaxseeds, walnuts, almonds, and seeds. ? Omega-3 fats. These are found in foods such as salmon, mackerel, sardines, tuna, flaxseed oil, and ground flaxseeds.  Choose grain products that have whole  grains. Look for the word "whole" as the first word in the ingredient list. Cooking  Cook foods using methods other than frying. Baking, boiling, grilling, and broiling are some healthy options.  Eat more home-cooked food and less restaurant, buffet, and fast food.  Avoid cooking using saturated fats. ? Animal sources of saturated fats include meats, butter, and cream. ? Plant sources of saturated fats include palm oil, palm kernel oil, and coconut oil. Meal planning   At meals, imagine dividing your plate into fourths: ? Fill one-half of your plate with vegetables and green salads. ? Fill one-fourth of your plate with whole grains. ? Fill one-fourth of your plate with lean protein foods.  Eat fish that is high in omega-3 fats at least two times a week.  Eat more foods that contain fiber, such as whole grains, beans, apples, broccoli, carrots, peas, and barley. These foods help promote healthy cholesterol levels in the blood. Recommended foods Grains  Whole grains, such as whole wheat or whole grain breads, crackers, cereals, and pasta. Unsweetened oatmeal, bulgur, barley, quinoa, or brown rice. Corn or whole wheat flour tortillas. Vegetables  Fresh or frozen vegetables (raw, steamed, roasted, or grilled). Green salads. Fruits  All fresh, canned (in natural juice), or frozen fruits. Meats and other protein foods  Ground beef (85% or leaner), grass-fed beef, or beef trimmed of fat. Skinless chicken or turkey. Ground chicken or turkey. Pork trimmed of fat. All fish and seafood. Egg whites. Dried beans, peas, or lentils. Unsalted nuts or seeds. Unsalted   canned beans. Natural nut butters without added sugar and oil. Dairy  Low-fat or nonfat dairy products, such as skim or 1% milk, 2% or reduced-fat cheeses, low-fat and fat-free ricotta or cottage cheese, or plain low-fat and nonfat yogurt. Fats and oils  Tub margarine without trans fats. Light or reduced-fat mayonnaise and salad  dressings. Avocado. Olive, canola, sesame, or safflower oils. The items listed above may not be a complete list of recommended foods or beverages. Contact your dietitian for more options. Foods to avoid Grains  White bread. White pasta. White rice. Cornbread. Bagels, pastries, and croissants. Crackers and snack foods that contain trans fat and hydrogenated oils. Vegetables  Vegetables cooked in cheese, cream, or butter sauce. Fried vegetables. Fruits  Canned fruit in heavy syrup. Fruit in cream or butter sauce. Fried fruit. Meats and other protein foods  Fatty cuts of meat. Ribs, chicken wings, bacon, sausage, bologna, salami, chitterlings, fatback, hot dogs, bratwurst, and packaged lunch meats. Liver and organ meats. Whole eggs and egg yolks. Chicken and turkey with skin. Fried meat. Dairy  Whole or 2% milk, cream, half-and-half, and cream cheese. Whole milk cheeses. Whole-fat or sweetened yogurt. Full-fat cheeses. Nondairy creamers and whipped toppings. Processed cheese, cheese spreads, and cheese curds. Beverages  Alcohol. Sugar-sweetened drinks such as sodas, lemonade, and fruit drinks. Fats and oils  Butter, stick margarine, lard, shortening, ghee, or bacon fat. Coconut, palm kernel, and palm oils. Sweets and desserts  Corn syrup, sugars, honey, and molasses. Candy. Jam and jelly. Syrup. Sweetened cereals. Cookies, pies, cakes, donuts, muffins, and ice cream. The items listed above may not be a complete list of foods and beverages to avoid. Contact your dietitian for more information. Summary  Your body needs fat and cholesterol for basic functions. However, eating too much of these things can be harmful to your health.  Work with your health care provider and dietitian to follow a diet low in fat and cholesterol. Doing this may help lower your risk for heart disease and other conditions.  Choose healthy fats, such as monounsaturated and polyunsaturated fats, and foods high in  omega-3 fatty acids.  Eat fiber-rich foods, such as whole grains, beans, peas, fruits, and vegetables.  Limit or avoid alcohol, fried foods, and foods high in saturated fats, partially hydrogenated oils, and sugar. This information is not intended to replace advice given to you by your health care provider. Make sure you discuss any questions you have with your health care provider. Document Revised: 10/31/2017 Document Reviewed: 08/05/2017 Elsevier Patient Education  2020 Elsevier Inc.  

## 2020-06-20 NOTE — Progress Notes (Signed)
    Procedures performed today:    None.  Independent interpretation of notes and tests performed by another provider:   None.  Brief History, Exam, Impression, and Recommendations:    Primary osteoarthritis of both knees Linda Lam is a pleasant 64 year old female, she is no stranger to the gym. She has known osteoarthritis in her left knee based on an MRI from 2017, she is having mild pain at the medial joint line, no mechanical symptoms, she desires to avoid a pharmacologic approach, we will have her do some home rehab exercises, ice the knee, wear a knee sleeve, and return to see me on an as-needed basis.  Annual physical exam Today I also provided her with some COVID-19 vaccine counseling, we explained the mechanism of action. She is interested in the vaccine but does not want it in her deltoid, she would prefer it in the gluteus maximus, I told her any muscle is a possible target for intramuscular vaccines,    ___________________________________________ Linda Lam, M.D., ABFM., CAQSM. Primary Care and [place] Instructor of [employer]

## 2020-06-20 NOTE — Assessment & Plan Note (Signed)
Linda Lam is a pleasant 64 year old female, she is no stranger to the gym. She has known osteoarthritis in her left knee based on an MRI from 2017, she is having mild pain at the medial joint line, no mechanical symptoms, she desires to avoid a pharmacologic approach, we will have her do some home rehab exercises, ice the knee, wear a knee sleeve, and return to see me on an as-needed basis.

## 2020-08-15 ENCOUNTER — Other Ambulatory Visit: Payer: Self-pay | Admitting: Sports Medicine

## 2020-08-15 DIAGNOSIS — Z1231 Encounter for screening mammogram for malignant neoplasm of breast: Secondary | ICD-10-CM

## 2020-09-07 ENCOUNTER — Other Ambulatory Visit: Payer: Self-pay

## 2020-09-07 ENCOUNTER — Ambulatory Visit (INDEPENDENT_AMBULATORY_CARE_PROVIDER_SITE_OTHER): Payer: 59 | Admitting: Sports Medicine

## 2020-09-07 ENCOUNTER — Encounter: Payer: Self-pay | Admitting: Sports Medicine

## 2020-09-07 ENCOUNTER — Ambulatory Visit (INDEPENDENT_AMBULATORY_CARE_PROVIDER_SITE_OTHER): Payer: 59

## 2020-09-07 DIAGNOSIS — R2241 Localized swelling, mass and lump, right lower limb: Secondary | ICD-10-CM

## 2020-09-07 DIAGNOSIS — Z1231 Encounter for screening mammogram for malignant neoplasm of breast: Secondary | ICD-10-CM | POA: Diagnosis not present

## 2020-09-07 DIAGNOSIS — R195 Other fecal abnormalities: Secondary | ICD-10-CM | POA: Diagnosis not present

## 2020-09-07 NOTE — Patient Instructions (Signed)
Lipoma  A lipoma is a noncancerous (benign) tumor that is made up of fat cells. This is a very common type of soft-tissue growth. Lipomas are usually found under the skin (subcutaneous). They may occur in any tissue of the body that contains fat. Common areas for lipomas to appear include the back, arms, shoulders, buttocks, and thighs. Lipomas grow slowly, and they are usually painless. Most lipomas do not cause problems and do not require treatment. What are the causes? The cause of this condition is not known. What increases the risk? You are more likely to develop this condition if:  You are 40-60 years old.  You have a family history of lipomas. What are the signs or symptoms? A lipoma usually appears as a small, round bump under the skin. In most cases, the lump will:  Feel soft or rubbery.  Not cause pain or other symptoms. However, if a lipoma is located in an area where it pushes on nerves, it can become painful or cause other symptoms. How is this diagnosed? A lipoma can usually be diagnosed with a physical exam. You may also have tests to confirm the diagnosis and to rule out other conditions. Tests may include:  Imaging tests, such as a CT scan or an MRI.  Removal of a tissue sample to be looked at under a microscope (biopsy). How is this treated? Treatment for this condition depends on the size of the lipoma and whether it is causing any symptoms.  For small lipomas that are not causing problems, no treatment is needed.  If a lipoma is bigger or it causes problems, surgery may be done to remove the lipoma. Lipomas can also be removed to improve appearance. Most often, the procedure is done after applying a medicine that numbs the area (local anesthetic).  Liposuction may be done to reduce the size of the lipoma before it is removed through surgery, or it may be done to remove the lipoma. Lipomas are removed with this method in order to limit incision size and scarring. A  liposuction tube is inserted through a small incision into the lipoma, and the contents of the lipoma are removed through the tube with suction. Follow these instructions at home:  Watch your lipoma for any changes.  Keep all follow-up visits as told by your health care provider. This is important. Contact a health care provider if:  Your lipoma becomes larger or hard.  Your lipoma becomes painful, red, or increasingly swollen. These could be signs of infection or a more serious condition. Get help right away if:  You develop tingling or numbness in an area near the lipoma. This could indicate that the lipoma is causing nerve damage. Summary  A lipoma is a noncancerous tumor that is made up of fat cells.  Most lipomas do not cause problems and do not require treatment.  If a lipoma is bigger or it causes problems, surgery may be done to remove the lipoma.  Contact a health care provider if your lipoma becomes larger or hard, or if it becomes painful, red, or increasingly swollen. Pain, redness, and swelling could be signs of infection or a more serious condition. This information is not intended to replace advice given to you by your health care provider. Make sure you discuss any questions you have with your health care provider. Document Revised: 07/05/2019 Document Reviewed: 07/05/2019 Elsevier Patient Education  2020 Elsevier Inc.  

## 2020-09-07 NOTE — Assessment & Plan Note (Signed)
Positive Cologuard was followed up with colonoscopy back in 2018, this revealed a tubular adenoma, 5-year follow-up was recommended.

## 2020-09-07 NOTE — Progress Notes (Signed)
    Procedures performed today:    None.  Independent interpretation of notes and tests performed by another provider:   None.  Brief History, Exam, Impression, and Recommendations:    Mass of lower leg, right This is a pleasant 64 year old female, for the past several months she is noted a small nontender lump on the lateral aspect of her right knee. This feels like a soft lipoma, her knee exam is completely stable, no effusion. I would like a soft tissue ultrasound to confirm the diagnosis, I did advise her these were benign and were best left alone, if the cosmetic appearance was bothering her certainly surgical excision could be an option though I would like to have this done by a plastic surgeon in the operating room considering its location near the common peroneal nerve and LCL.  Positive colorectal cancer screening using DNA-based stool test Positive Cologuard was followed up with colonoscopy back in 2018, this revealed a tubular adenoma, 5-year follow-up was recommended.    ___________________________________________ Gwen Her. [name], M.D., ABFM., CAQSM. Primary Care and [place] Instructor of [place] [employer] [facility]

## 2020-09-07 NOTE — Assessment & Plan Note (Addendum)
This is a pleasant 64 year old female, for the past several months she is noted a small nontender lump on the lateral aspect of her right knee. This feels like a soft lipoma, her knee exam is completely stable, no effusion. I would like a soft tissue ultrasound to confirm the diagnosis, I did advise her these were benign and were best left alone, if the cosmetic appearance was bothering her certainly surgical excision could be an option though I would like to have this done by a plastic surgeon in the operating room considering its location near the common peroneal nerve and LCL.

## 2020-09-08 ENCOUNTER — Ambulatory Visit (INDEPENDENT_AMBULATORY_CARE_PROVIDER_SITE_OTHER): Payer: 59

## 2020-09-08 DIAGNOSIS — R2241 Localized swelling, mass and lump, right lower limb: Secondary | ICD-10-CM

## 2020-09-10 DIAGNOSIS — M5416 Radiculopathy, lumbar region: Secondary | ICD-10-CM

## 2020-09-11 NOTE — Telephone Encounter (Signed)
Called and advised kathy at [facility]

## 2020-09-11 NOTE — Telephone Encounter (Signed)
Epidural ordered, please contact [facility] for scheduling. 

## 2020-09-20 ENCOUNTER — Other Ambulatory Visit: Payer: 59

## 2021-01-09 ENCOUNTER — Telehealth (INDEPENDENT_AMBULATORY_CARE_PROVIDER_SITE_OTHER): Payer: 59 | Admitting: Physician Assistant

## 2021-01-09 ENCOUNTER — Encounter: Payer: Self-pay | Admitting: Physician Assistant

## 2021-01-09 VITALS — BP 142/86 | HR 75 | Temp 98.2°F | Ht 61.0 in | Wt 135.0 lb

## 2021-01-09 DIAGNOSIS — Z20822 Contact with and (suspected) exposure to covid-19: Secondary | ICD-10-CM

## 2021-01-09 DIAGNOSIS — J3489 Other specified disorders of nose and nasal sinuses: Secondary | ICD-10-CM | POA: Diagnosis not present

## 2021-01-09 DIAGNOSIS — J00 Acute nasopharyngitis [common cold]: Secondary | ICD-10-CM | POA: Diagnosis not present

## 2021-01-09 MED ORDER — IPRATROPIUM BROMIDE 0.06 % NA SOLN: 2.0000 | Freq: Three times a day (TID) | NASAL | 0 refills | Status: DC

## 2021-01-09 NOTE — Progress Notes (Signed)
  Started December 26 with runny nose Losing voice yesterday Soreness on left side of face Has been crying a lot (brother just passed away last week)  Not taking anything OTC for symptoms

## 2021-01-09 NOTE — Progress Notes (Signed)
Patient ID: Linda Lam, female   DOB: Feb 21, 1956, 65 y.o.   MRN: [id number] .Marland KitchenVirtual Visit via Telephone Note  I connected with Linda Lam on 01/09/21 at 10:30 AM EST by telephone and verified that I am speaking with the correct person using two identifiers.  Location: Patient: home Provider: clinic  .Marland KitchenParticipating in visit:  Patient: Linda Lam Provider: Iran Planas PA-C   I discussed the limitations, risks, security and privacy concerns of performing an evaluation and management service by telephone and the availability of in person appointments. I also discussed with the patient that there may be a patient responsible charge related to this service. The patient expressed understanding and agreed to proceed.   History of Present Illness: Patient is a 65 year old female on no medications who calls into the clinic with clear rhinorrhea since December 26.  There is no progression of symptoms until yesterday.  She lost her voice yesterday and she noticed some sinus pressure on the left side of face.  She has been crying a lot.  Her brother passed away from Covid last week.  She was in the room with him and has back here while he was passing away.  She continues to have constant rhinorrhea.  Most the time the discharge is clear but occasionally it is a greenish color.  She denies any sore throat, ear pain, cough, shortness of breath, body aches, fever, chills, GI symptoms.  She has not taken anything to make better.  .. Active Ambulatory Problems    Diagnosis Date Noted  . Trace mitral regurgitation by prior echocardiogram 02/25/2013  . Tobacco use disorder 02/25/2013  . S/P hysterectomy 02/25/2013  . Fibrocystic breast disease 02/25/2013  . Eczema 02/25/2013  . Skin cancer, basal cell 02/25/2013  . Sinus bradycardia by electrocardiogram 02/25/2013  . Annual physical exam 06/27/2015  . Primary osteoarthritis of both knees 06/27/2015  . Primary osteoarthritis of left hip  06/27/2015  . Left lumbar radiculopathy 04/18/2016  . Positive colorectal cancer screening using DNA-based stool test 02/24/2017  . S/P left bunionectomy 01/20/2018  . Radiculitis of left cervical region 09/11/2018  . Stye 09/27/2019  . SCC (squamous cell carcinoma) 08/18/2013  . Mass of lower leg, right 09/07/2020   Resolved Ambulatory Problems    Diagnosis Date Noted  . Right shoulder pain 06/27/2015  . Abnormal weight gain 06/27/2015  . Skin lesion of right leg 08/02/2015  . Left tennis elbow 10/03/2015  . Preoperative clearance 12/19/2017  . Acute maxillary sinusitis 11/03/2019   Past Medical History:  Diagnosis Date  . Hyperlipidemia    Reviewed med, allergy, problem list.     Observations/Objective: No acute distress No cough No labored breathing.   .. Today's Vitals   01/09/21 1028  BP: (!) 142/86  Pulse: 75  Temp: 98.2 F (36.8 C)  SpO2: 99%  Weight: 135 lb (61.2 kg)  Height: 5\' 1"  (1.549 m)   Body mass index is 25.51 kg/m.    Assessment and Plan: Marland KitchenMarland KitchenZahirah was seen today for sinusitis.  Diagnoses and all orders for this visit:  Close exposure to COVID-19 virus -     Novel Coronavirus, NAA ([facility])  Acute rhinitis -     ipratropium (ATROVENT) 0.06 % nasal spray; Place 2 sprays into both nostrils 3 (three) times daily. -     Novel Coronavirus, NAA ([facility])  Sinus pressure -     Novel Coronavirus, NAA ([facility])   Symptoms are not classic Covid.  She did have a close exposure  with Covid.  Asked that she quarantine until test results are back or 5 days from symptoms and not symptomatic.  Discussed immune support with vitamin D, vitamin C and zinc.  Does not like she has some chronic rhinitis.  Atrovent nasal spray could help.  If negative for COVID and still having progressively worsening sinus symptoms could consider treating for sinusitis.  Clearly her immune system is down due to stress.  Follow Up Instructions:    I discussed the assessment  and treatment plan with the patient. The patient was provided an opportunity to ask questions and all were answered. The patient agreed with the plan and demonstrated an understanding of the instructions.   The patient was advised to call back or seek an in-person evaluation if the symptoms worsen or if the condition fails to improve as anticipated.  I provided 15 minutes of non-face-to-face time during this encounter.   Iran Planas, PA-C

## 2021-01-10 LAB — NOVEL CORONAVIRUS, NAA: SARS-CoV-2, NAA: NOT DETECTED

## 2021-01-10 LAB — SARS-COV-2, NAA 2 DAY TAT

## 2021-01-11 NOTE — Progress Notes (Signed)
You are negative for covid. How are you feeling today?

## 2021-02-26 ENCOUNTER — Other Ambulatory Visit: Payer: Self-pay

## 2021-02-26 ENCOUNTER — Telehealth (INDEPENDENT_AMBULATORY_CARE_PROVIDER_SITE_OTHER): Payer: 59 | Admitting: Family Medicine

## 2021-02-26 ENCOUNTER — Encounter: Payer: Self-pay | Admitting: Family Medicine

## 2021-02-26 DIAGNOSIS — J0101 Acute recurrent maxillary sinusitis: Secondary | ICD-10-CM | POA: Diagnosis not present

## 2021-02-26 MED ORDER — DOXYCYCLINE HYCLATE 100 MG PO TABS: 100.0000 mg | ORAL_TABLET | Freq: Two times a day (BID) | ORAL | 0 refills | Status: DC

## 2021-02-26 NOTE — Assessment & Plan Note (Signed)
Recommend increased fluids and supportive care at home.  She may continue OTC medication as needed.  Add doxycycline 100mg  BID.  Instructed to follow up if symptoms are not improving.

## 2021-02-26 NOTE — Progress Notes (Addendum)
Linda Lam - 65 y.o. female MRN [id number]  Date of birth: 19-Mar-1956   This visit type was conducted due to national recommendations for restrictions regarding the COVID-19 Pandemic (e.g. social distancing).  This format is felt to be most appropriate for this patient at this time.  All issues noted in this document were discussed and addressed.  No physical exam was performed (except for noted visual exam findings with Video Visits).  I discussed the limitations of evaluation and management by telemedicine and the availability of in person appointments. The patient expressed understanding and agreed to proceed.  I connected with@ on 02/26/21 at  3:40 PM EDT by a video enabled telemedicine application and verified that I am speaking with the correct person using two identifiers.  Interactive audio and video telecommunications were attempted between this provider and patient, however failed, due to patient having technical difficulties OR patient did not have access to video capability.  We continued and completed visit with audio only.    Present at visit: [name], DO [name]   Patient Location: [facility] [address] [place] [address]   Provider location:   [facility]  Chief Complaint  Patient presents with  . Nasal Congestion    HPI  Linda Lam is a 65 y.o. female who presents via audio/video conferencing for a telehealth visit today.  She has complaint of nasal congestion, R sided sinus pain, ear pressure, and post nasal drainage.  Symptoms started about 9 days ago and haven't really improved.  She has tried antihistamines, sudafed, alka seltzer cold and sinus.  She has gotten some temporary relief from this.  She has not had any fever with this.     ROS:  A comprehensive ROS was completed and negative except as noted per HPI  Past Medical History:  Diagnosis Date  . Hyperlipidemia     Past Surgical History:  Procedure Laterality Date  .  ABDOMINAL HYSTERECTOMY    . BUNIONECTOMY      Family History  Problem Relation Age of Onset  . Hypertension Mother   . Diabetes Mother   . Stroke Father   . Diabetes Brother   . Diabetes Maternal Grandmother     Social History   Socioeconomic History  . Marital status: Married    Spouse name: Not on file  . Number of children: Not on file  . Years of education: Not on file  . Highest education level: Not on file  Occupational History  . Not on file  Tobacco Use  . Smoking status: Light Tobacco Smoker    Types: Cigarettes  . Smokeless tobacco: Never Used  Substance and Sexual Activity  . Alcohol use: Yes    Alcohol/week: 5.0 standard drinks    Types: 5 Standard drinks or equivalent per week  . Drug use: No  . Sexual activity: Yes    Partners: Male    Birth control/protection: Surgical  Other Topics Concern  . Not on file  Social History Narrative  . Not on file   Social Determinants of Health   Financial Resource Strain: Not on file  Food Insecurity: Not on file  Transportation Needs: Not on file  Physical Activity: Not on file  Stress: Not on file  Social Connections: Not on file  Intimate Partner Violence: Not on file     Current Outpatient Medications:  .  doxycycline (VIBRA-TABS) 100 MG tablet, Take 1 tablet (100 mg total) by mouth 2 (two) times daily.,  Disp: 20 tablet, Rfl: 0 .  ipratropium (ATROVENT) 0.06 % nasal spray, Place 2 sprays into both nostrils 3 (three) times daily., Disp: 15 mL, Rfl: 0  EXAM:  VITALS per patient if applicable: Ht 5\' 1"  (1.549 m)   Wt 132 lb (59.9 kg)   LMP 02/25/1998   BMI 24.94 kg/m   GENERAL: alert, oriented,  in no acute distress  PSYCH/NEURO: pleasant and cooperative, no obvious depression or anxiety, speech and thought processing grossly intact  ASSESSMENT AND PLAN:  Discussed the following assessment and plan:  Acute maxillary sinusitis Recommend increased fluids and supportive care at home.  She may  continue OTC medication as needed.  Add doxycycline 100mg  BID.  Instructed to follow up if symptoms are not improving.    30 minutes spent including pre visit preparation, review of prior notes and labs, encounter with patient via telephone visit and same day documentation.    I discussed the assessment and treatment plan with the patient. The patient was provided an opportunity to ask questions and all were answered. The patient agreed with the plan and demonstrated an understanding of the instructions.   The patient was advised to call back or seek an in-person evaluation if the symptoms worsen or if the condition fails to improve as anticipated.    [name], DO

## 2021-02-26 NOTE — Progress Notes (Signed)
Symptoms started on Sunday.   Scratchy throat progressed into a bad cold Congestion, coughing. Rt side of face feels weird. Ear is clogged. Runny nose (colorless)  Meds: 60mg  Sudafed and Afrin nasal spray  AlkaSeltzer severe cold and flu  Tessalon  Mucinex  Tylenol  [phone])

## 2021-06-22 ENCOUNTER — Telehealth: Payer: Self-pay

## 2021-06-22 DIAGNOSIS — U071 COVID-19: Secondary | ICD-10-CM | POA: Insufficient documentation

## 2021-06-22 MED ORDER — PAXLOVID 20 X 150 MG & 10 X 100MG PO TBPK: 3.0000 | ORAL_TABLET | Freq: Two times a day (BID) | ORAL | 0 refills | Status: AC

## 2021-06-22 NOTE — Assessment & Plan Note (Signed)
COVID positive in a 65 year old, mild symptoms, adding Paxlovid, may use DayQuil/NyQuil or something similar for symptomatic control.  If she needs anything for nausea I am happy to call in some Zofran as well.

## 2021-06-22 NOTE — Telephone Encounter (Signed)
Absolutely, COVID positive in a 65 year old, adding Paxlovid, may use DayQuil/NyQuil or something similar for symptomatic control.  If she needs anything for nausea I am happy to call in some Zofran as well.

## 2021-06-22 NOTE — Telephone Encounter (Signed)
Symptoms started yesterday feeling Achy and a sore throat with headache. Her main complaint today is a headache. She report taking 2 ibuprofen which has helped some. She stated that she has no appetite and does have some nausea but no vomiting. She wants to know if there is anything to lessen the headache/symptoms. Tested positive today.   COVID vaccine [employer] - [address]

## 2021-06-25 NOTE — Telephone Encounter (Signed)
Patient did pick up the prescription and, other than the metallic taste and fatigue, she is feeling better.

## 2021-07-05 ENCOUNTER — Telehealth: Payer: Self-pay

## 2021-07-05 NOTE — Telephone Encounter (Signed)
Patient called stating she just had COVID on 06/22/21 and was treated with Paxlovid for 5 days. She seemed fine and returned to work. She started with cold symptoms and her work asked her to re-test for COVID and it came back positive again. Since these are from simple at home tests, should she get a PCR test done to confirm? Is this still from the first time or a second round of COVID? She stated, either way, she does not want to take [place] again.

## 2021-07-09 NOTE — Telephone Encounter (Signed)
Patient called and spoke with triage earlier today.

## 2021-07-15 ENCOUNTER — Encounter (HOSPITAL_BASED_OUTPATIENT_CLINIC_OR_DEPARTMENT_OTHER): Payer: Self-pay

## 2021-07-15 ENCOUNTER — Emergency Department (HOSPITAL_BASED_OUTPATIENT_CLINIC_OR_DEPARTMENT_OTHER): Payer: 59

## 2021-07-15 ENCOUNTER — Other Ambulatory Visit: Payer: Self-pay

## 2021-07-15 ENCOUNTER — Emergency Department (HOSPITAL_BASED_OUTPATIENT_CLINIC_OR_DEPARTMENT_OTHER)
Admission: EM | Admit: 2021-07-15 | Discharge: 2021-07-15 | Disposition: A | Discharge status: Home / Self Care | Payer: 59 | Attending: Emergency Medicine | Admitting: Emergency Medicine

## 2021-07-15 DIAGNOSIS — Z85828 Personal history of other malignant neoplasm of skin: Secondary | ICD-10-CM | POA: Insufficient documentation

## 2021-07-15 DIAGNOSIS — M25562 Pain in left knee: Secondary | ICD-10-CM | POA: Insufficient documentation

## 2021-07-15 DIAGNOSIS — Z8616 Personal history of COVID-19: Secondary | ICD-10-CM | POA: Insufficient documentation

## 2021-07-15 DIAGNOSIS — F17210 Nicotine dependence, cigarettes, uncomplicated: Secondary | ICD-10-CM | POA: Insufficient documentation

## 2021-07-15 NOTE — ED Notes (Signed)
Pt discharged to home. Discharge instructions have been discussed with patient and/or family members. Pt verbally acknowledges understanding d/c instructions, and endorses comprehension to checkout at registration before leaving.  °

## 2021-07-15 NOTE — Discharge Instructions (Addendum)
Please wear the knee immobilizer for comfort.  Please use crutches.  Please alternate between Tylenol and ibuprofen as discussed below.  Rest ice and elevate your knee.  Please follow-up with your orthopedist.  Please use Tylenol or ibuprofen for pain.  You may use 600 mg ibuprofen every 6 hours or 1000 mg of Tylenol every 6 hours.  You may choose to alternate between the 2.  This would be most effective.  Not to exceed 4 g of Tylenol within 24 hours.  Not to exceed 3200 mg ibuprofen 24 hours.   Apart from notable arthritic changes in her left knee there is no fracture or dislocation on your x-ray.  Your exam today was reassuring.  Ultimately many people experience the pain that resolves with time and anti-inflammatories however if you continue to have symptoms your orthopedist will be able to further evaluate this.

## 2021-07-15 NOTE — ED Triage Notes (Signed)
Pt arrives with pain to left knee states that she started having knee pain July 29th when she had Covid. Pt has knee sleeve from home on left knee, pt reports trouble with ambulation and increased pain when standing, states she when to get water and felt something snap in her left knee.

## 2021-07-15 NOTE — ED Provider Notes (Signed)
[facility] Provider Note   CSN: [id number] Arrival date & time: 07/15/21  1154     History Chief Complaint  Patient presents with   Knee Pain    Linda Lam is a 65 y.o. female.  HPI Patient is a 65 year old female with past medical history significant for osteoarthritis of both knees and left hip, eczema  Patient presented to ER today with complaints of left knee pain for approximately 3 weeks.  She states that she works out regularly.  This morning she went to kickboxing but did not do any kicking because of her knee pain.  She states that she did not have any injury to her knee but while walking out of the facility she had sudden onset worsening of her left knee pain.  She states that it was so bad that she had to sit down and could not get back up.  She was carried out of the facility.  She came to the ER today with continued pain.  She states it is improved significantly from this morning however still feels painful.  She is able to move her knee states it is painful to bear weight.  No trauma to her knee she states that she sat down slowly when the pain began.  She has not had any blood thinners denies any weakness or numbness in her lower extremity.    Past Medical History:  Diagnosis Date   Hyperlipidemia     Patient Active Problem List   Diagnosis Date Noted   COVID 06/22/2021   Mass of lower leg, right 09/07/2020   Acute maxillary sinusitis 11/03/2019   Stye 09/27/2019   Radiculitis of left cervical region 09/11/2018   S/P left bunionectomy 01/20/2018   Positive colorectal cancer screening using DNA-based stool test 02/24/2017   Left lumbar radiculopathy 04/18/2016   Annual physical exam 06/27/2015   Primary osteoarthritis of both knees 06/27/2015   Primary osteoarthritis of left hip 06/27/2015   SCC (squamous cell carcinoma) 08/18/2013   Trace mitral regurgitation by prior echocardiogram 02/25/2013   S/P hysterectomy  02/25/2013   Fibrocystic breast disease 02/25/2013   Eczema 02/25/2013   Skin cancer, basal cell 02/25/2013   Sinus bradycardia by electrocardiogram 02/25/2013    Past Surgical History:  Procedure Laterality Date   ABDOMINAL HYSTERECTOMY     BUNIONECTOMY       OB History     Gravida  2   Para      Term      Preterm      AB      Living  2      SAB      IAB      Ectopic      Multiple      Live Births              Family History  Problem Relation Age of Onset   Hypertension Mother    Diabetes Mother    Stroke Father    Diabetes Brother    Diabetes Maternal Grandmother     Social History   Tobacco Use   Smoking status: Light Smoker    Types: Cigarettes   Smokeless tobacco: Never  Substance Use Topics   Alcohol use: Yes    Alcohol/week: 5.0 standard drinks    Types: 5 Standard drinks or equivalent per week   Drug use: No    Home Medications Prior to Admission medications   Medication Sig Start Date End Date Taking?  Authorizing Provider  ipratropium (ATROVENT) 0.06 % nasal spray Place 2 sprays into both nostrils 3 (three) times daily. 01/09/21   Donella Stade, PA-C    Allergies    Demerol [meperidine]  Review of Systems   Review of Systems  Constitutional:  Negative for fever.  HENT:  Negative for congestion.   Respiratory:  Negative for shortness of breath.   Cardiovascular:  Negative for chest pain.  Gastrointestinal:  Negative for abdominal distention.  Musculoskeletal:        Left knee pain  Neurological:  Negative for dizziness and headaches.   Physical Exam Updated Vital Signs BP (!) 162/72 (BP Location: Left Arm)   Pulse 63   Temp 98.5 F (36.9 C) (Oral)   Resp 20   Ht '5\' 1"'$  (1.549 m)   Wt 59 kg   LMP 02/25/1998   SpO2 99%   BMI 24.56 kg/m   Physical Exam Vitals and nursing note reviewed.  Constitutional:      General: She is not in acute distress.    Appearance: Normal appearance. She is not ill-appearing.   HENT:     Head: Normocephalic and atraumatic.  Eyes:     General: No scleral icterus.       Right eye: No discharge.        Left eye: No discharge.     Conjunctiva/sclera: Conjunctivae normal.  Pulmonary:     Effort: Pulmonary effort is normal.     Breath sounds: No stridor.  Musculoskeletal:     Comments: Passively there is full range of motion of left knee actively patient almost completely extends the left knee but stops just short of full extension due to discomfort.  Is able to flex consistently.  No redness or swelling.  No effusion of left knee.  Anterior posterior drawer negative.  No notable tenderness to palpation of the knee no fluctuance lacerations or abrasions or contusions evident from the surface.  Bilateral DP pulses symmetric  Neurological:     Mental Status: She is alert and oriented to person, place, and time. Mental status is at baseline.    ED Results / Procedures / Treatments   Labs (all labs ordered are listed, but only abnormal results are displayed) Labs Reviewed - No data to display  EKG None  Radiology DG Knee Complete 4 Views Left  Result Date: 07/15/2021 CLINICAL DATA:  Left knee pain EXAM: LEFT KNEE - COMPLETE 4+ VIEW COMPARISON:  None. FINDINGS: Normal alignment. No fracture or dislocation. Mild tricompartmental degenerative arthritis. No effusion. Soft tissues are unremarkable. IMPRESSION: Mild tricompartmental degenerative arthritis. Electronically Signed   By: Fidela Salisbury M.D.   On: 07/15/2021 13:36    Procedures Procedures   Medications Ordered in ED Medications - No data to display  ED Course  I have reviewed the triage vital signs and the nursing notes.  Pertinent labs & imaging results that were available during my care of the patient were reviewed by me and considered in my medical decision making (see chart for details).    MDM Rules/Calculators/A&P                           Patient has left knee pain since 3 weeks ago acutely  worse today potentially was twisting when this occurred question meniscal injury versus worsening of the arthritis evident on her left knee x-ray.  I personally reviewed the x-ray.  Agree cardiology read.  I do not see any  obvious bone spurs.  She will follow-up with her orthopedist.  Recommended rest ice elevation compression and Tylenol and ibuprofen.  Low suspicion for gonococcal or septic arthritis.  Patient is overall well-appearing has no systemic symptoms.  Will discharge home at this time.  She is agreeable to plan and understands follow-up instructions and return precautions.  Final Clinical Impression(s) / ED Diagnoses Final diagnoses:  Left knee pain, unspecified chronicity    Rx / DC Orders ED Discharge Orders     None        Tedd Sias, [place] 07/15/21 1429    Arnaldo Natal, MD 07/15/21 1506

## 2021-07-16 ENCOUNTER — Other Ambulatory Visit: Payer: Self-pay

## 2021-07-16 ENCOUNTER — Ambulatory Visit: Payer: 59 | Admitting: Medical-Surgical

## 2021-07-16 ENCOUNTER — Telehealth: Payer: Self-pay | Admitting: General Practice

## 2021-07-16 ENCOUNTER — Encounter: Payer: Self-pay | Admitting: Medical-Surgical

## 2021-07-16 VITALS — BP 135/75 | HR 65 | Resp 20 | Wt 130.0 lb

## 2021-07-16 DIAGNOSIS — M25562 Pain in left knee: Secondary | ICD-10-CM | POA: Diagnosis not present

## 2021-07-16 MED ORDER — MELOXICAM 15 MG PO TABS: 15.0000 mg | ORAL_TABLET | Freq: Every day | ORAL | 0 refills | Status: DC

## 2021-07-16 MED ORDER — METHYLPREDNISOLONE 4 MG PO TBPK: ORAL_TABLET | ORAL | 0 refills | Status: DC

## 2021-07-16 MED ORDER — OMEPRAZOLE 20 MG PO CPDR: 20.0000 mg | DELAYED_RELEASE_CAPSULE | Freq: Every day | ORAL | 11 refills | Status: DC

## 2021-07-16 NOTE — Progress Notes (Signed)
  HPI with pertinent ROS:   CC: left knee pain  HPI: Pleasant 65 year old female presenting with complaints of left knee pain for several weeks that suddenly worsened yesterday. Per patient report, knee pain started after a 3 day trip to [place]. On her return, she was given Tylenol while at work which provided relief. The next day, she developed a fever and tested positive for COVID. During her COVID course, her knee kept hurting. Once her symptoms improved, she resumed working out daily. On Tuesday of last week, she was doing a step class and tweaked her knee. Yesterday, she did a kickboxing class but avoided doing the kicks. When she was done with her workout, she turned and felt a snap in the knee. After that, she couldn't put any weight on her left leg due to pain. She went to the ED where they completed a full exam and x-rays. Outside of arthritis, no abnormalities were found. She was discharged there with a knee immobilizer in place that she reports she insisted upon so that she didn't move her knee. Today, she continues to wear the immobilizer and is using a walker to ambulate. Does have a soft knee brace at home to use as an alternative. No fevers, chills, chest pain, SOB, leg swelling/erythema.   I reviewed the past medical history, family history, social history, surgical history, and allergies today and no changes were needed.  Please see the problem list section below in epic for further details.   Physical exam:   General: Well Developed, well nourished, and in no acute distress.  Neuro: Alert and oriented x3.  HEENT: Normocephalic, atraumatic.  Skin: Warm and dry. Cardiac: Regular rate and rhythm, no murmurs rubs or gallops, no lower extremity edema.  Respiratory: Clear to auscultation bilaterally. Not using accessory muscles, speaking in full sentences. Left knee: Mildly limited extension with moderately limited flexion due to pain. No erythema, effusion, or crepitus. Negative  anterior/posterior drawer. Pain along the medial knee with flexion at approximately 90 degrees and full extension. Pain also present with varus pressure and medial patellar manipulation. Able to bear weight to step up on the exam table.   Impression and Recommendations:    1. Medial knee pain, left Has had prednisone in the past and had varying reactions with it. Sending in Medrol Dose pack for 6 days. After finishing that, start Meloxicam '15mg'$  daily along with Omeprazole '20mg'$  daily for GI protection. She is concerned about the cost of physical therapy so printed knee exercises provided with instructions to complete them daily. Reviewed RICE recommendations. Would recommend avoiding the knee immobilizer in the setting of known arthritis as this can make the pain worse and slow recovery. Discussed avoiding gym activities until pain is better.  - MR Knee Left  Wo Contrast; Future  Return in about 4 weeks (around 08/13/2021) for left knee pain follow up with Dr. Darene Lamer. ___________________________________________ Clearnce Sorrel, DNP, APRN, FNP-BC Primary Care and [place]

## 2021-07-16 NOTE — Telephone Encounter (Signed)
Transition Care Management Follow-up Telephone Call Date of discharge and from where: 07/15/21 from [facility] How have you been since you were released from the hospital? Patient had OV today with Samuel Bouche, NP Any questions or concerns? No

## 2021-07-16 NOTE — Progress Notes (Deleted)
ologuard

## 2021-07-28 ENCOUNTER — Ambulatory Visit (INDEPENDENT_AMBULATORY_CARE_PROVIDER_SITE_OTHER): Payer: 59

## 2021-07-28 ENCOUNTER — Other Ambulatory Visit: Payer: Self-pay

## 2021-07-28 DIAGNOSIS — G8929 Other chronic pain: Secondary | ICD-10-CM

## 2021-07-28 DIAGNOSIS — M25562 Pain in left knee: Secondary | ICD-10-CM

## 2021-08-03 ENCOUNTER — Ambulatory Visit: Payer: 59 | Admitting: Sports Medicine

## 2021-08-03 ENCOUNTER — Ambulatory Visit (INDEPENDENT_AMBULATORY_CARE_PROVIDER_SITE_OTHER): Payer: 59

## 2021-08-03 ENCOUNTER — Other Ambulatory Visit: Payer: Self-pay

## 2021-08-03 DIAGNOSIS — M170 Bilateral primary osteoarthritis of knee: Secondary | ICD-10-CM | POA: Diagnosis not present

## 2021-08-03 NOTE — Assessment & Plan Note (Signed)
This is a pleasant 65 year old female, she has known knee osteoarthritis, she has been treated conservatively for the past several years. She is no stranger to the gym. She was doing a high intensity kickboxing type class, twisted and felt immediate pain, had swelling and difficulty bearing weight. Ultimately she had an MRI that showed moderate osteoarthritis, tricompartmental, she also has medial meniscal tearing. Minimal mechanical symptoms, today we aspirated and injected her knee, she would like to discuss the case with [name] which I think is appropriate, I did explain to her the limitations with arthroscopic debridement and a knee that had significant osteoarthritis in that if injections failed she would likely be a candidate for arthroplasty, Continue bracing, conditioning exercises given. Return to see me in a month.

## 2021-08-03 NOTE — Progress Notes (Signed)
    Procedures performed today:    Procedure: Real-time Ultrasound Guided aspiration/injection of left knee Device: Samsung HS60  Verbal informed consent obtained.  Time-out conducted.  Noted no overlying erythema, induration, or other signs of local infection.  Skin prepped in a sterile fashion.  Local anesthesia: Topical Ethyl chloride.  With sterile technique and under real time ultrasound guidance: Effusion, 5 mils of clear, straw-colored fluid aspirated, syringe switched and 1 cc Kenalog 40, 2 cc lidocaine, 2 cc bupivacaine injected easily Completed without difficulty  Advised to call if fevers/chills, erythema, induration, drainage, or persistent bleeding.  Images permanently stored and available for review in PACS.  Impression: Technically successful ultrasound guided injection.  Independent interpretation of notes and tests performed by another provider:   None.  Brief History, Exam, Impression, and Recommendations:    Primary osteoarthritis of both knees This is a pleasant 65 year old female, she has known knee osteoarthritis, she has been treated conservatively for the past several years. She is no stranger to the gym. She was doing a high intensity kickboxing type class, twisted and felt immediate pain, had swelling and difficulty bearing weight. Ultimately she had an MRI that showed moderate osteoarthritis, tricompartmental, she also has medial meniscal tearing. Minimal mechanical symptoms, today we aspirated and injected her knee, she would like to discuss the case with [name] which I think is appropriate, I did explain to her the limitations with arthroscopic debridement and a knee that had significant osteoarthritis in that if injections failed she would likely be a candidate for arthroplasty, Continue bracing, conditioning exercises given. Return to see me in a month.    ___________________________________________ Gwen Her. [name], M.D., ABFM.,  CAQSM. Primary Care and [place] Instructor of [place] of [facility] of [facility]

## 2021-08-08 ENCOUNTER — Telehealth: Payer: Self-pay

## 2021-08-08 NOTE — Telephone Encounter (Signed)
Okay to just see [name] particularly if she is not feeling significantly better after the injection.

## 2021-08-08 NOTE — Telephone Encounter (Signed)
Patient reports that she scheduld an appt with you for 09/14/21 but should she see you before she sees Alusio or wait till she has information from him? Please advise.

## 2021-08-08 NOTE — Telephone Encounter (Signed)
Patient reports that so far the injection is doing great. She wants to just wait to see Dr. Maureen Ralphs. She asked that her 09/14/21 appt be cancelled and stated that if her pain returns, she would call back to reschedule.

## 2021-08-13 ENCOUNTER — Other Ambulatory Visit: Payer: Self-pay | Admitting: Sports Medicine

## 2021-08-13 ENCOUNTER — Ambulatory Visit: Payer: 59 | Admitting: Sports Medicine

## 2021-08-13 DIAGNOSIS — Z1231 Encounter for screening mammogram for malignant neoplasm of breast: Secondary | ICD-10-CM

## 2021-08-27 ENCOUNTER — Encounter (INDEPENDENT_AMBULATORY_CARE_PROVIDER_SITE_OTHER): Payer: 59

## 2021-08-27 DIAGNOSIS — M5416 Radiculopathy, lumbar region: Secondary | ICD-10-CM

## 2021-08-27 NOTE — Telephone Encounter (Signed)
I spent 5 total minutes of online digital evaluation and management services. 

## 2021-08-28 ENCOUNTER — Ambulatory Visit (INDEPENDENT_AMBULATORY_CARE_PROVIDER_SITE_OTHER): Payer: 59 | Admitting: Sports Medicine

## 2021-08-28 DIAGNOSIS — M170 Bilateral primary osteoarthritis of knee: Secondary | ICD-10-CM | POA: Diagnosis not present

## 2021-08-28 DIAGNOSIS — M48061 Spinal stenosis, lumbar region without neurogenic claudication: Secondary | ICD-10-CM | POA: Diagnosis not present

## 2021-08-28 NOTE — Progress Notes (Signed)
    Procedures performed today:    None.  Independent interpretation of notes and tests performed by another provider:   None.  Brief History, Exam, Impression, and Recommendations:    Lumbar spinal stenosis Linda Lam returns, she has severe central canal stenosis at L4-L5, in the past she responded extremely well to left L4-L5 interlaminar epidural, she is also responded well to transforaminal epidurals, I am ordering a repeat epidural, left L4-L5 interlaminar, patient does prefer Dr. Jeralyn Ruths. Declines pain medicine to hold her over in the meantime.  Primary osteoarthritis of both knees Linda Lam is also having increasing pain and swelling in her left knee, known knee osteoarthritis, has been treated conservatively for the past several years. Ultimately we obtained an MRI that showed moderate osteoarthritis, tricompartmental with medial meniscal tearing, we aspirated and injected her knee at the beginning of this month, she preferred [name] for surgical intervention, I have explained to her several times that arthroscopic intervention is typically ineffective in knees with this degree of arthritis and that she is a candidate for arthroplasty. She seems somewhat hesitant which I understand, she would also be a candidate for genicular radiofrequency ablation, we will let her have the second opinion from [name] first.    ___________________________________________ Gwen Her. [name], M.D., ABFM., CAQSM. Primary Care and [place] Instructor of [place] of [facility] of Medicine

## 2021-08-28 NOTE — Assessment & Plan Note (Signed)
Linda Lam returns, she has severe central canal stenosis at L4-L5, in the past she responded extremely well to left L4-L5 interlaminar epidural, she is also responded well to transforaminal epidurals, I am ordering a repeat epidural, left L4-L5 interlaminar, patient does prefer Dr. Jeralyn Ruths. Declines pain medicine to hold her over in the meantime.

## 2021-08-28 NOTE — Assessment & Plan Note (Signed)
Linda Lam is also having increasing pain and swelling in her left knee, known knee osteoarthritis, has been treated conservatively for the past several years. Ultimately we obtained an MRI that showed moderate osteoarthritis, tricompartmental with medial meniscal tearing, we aspirated and injected her knee at the beginning of this month, she preferred [name] for surgical intervention, I have explained to her several times that arthroscopic intervention is typically ineffective in knees with this degree of arthritis and that she is a candidate for arthroplasty. She seems somewhat hesitant which I understand, she would also be a candidate for genicular radiofrequency ablation, we will let her have the second opinion from [name] first.

## 2021-08-29 DIAGNOSIS — M48061 Spinal stenosis, lumbar region without neurogenic claudication: Secondary | ICD-10-CM

## 2021-08-29 MED ORDER — TRAMADOL HCL 50 MG PO TABS: 50.0000 mg | ORAL_TABLET | Freq: Three times a day (TID) | ORAL | 0 refills | Status: DC | PRN

## 2021-08-30 ENCOUNTER — Ambulatory Visit
Admission: RE | Admit: 2021-08-30 | Discharge: 2021-08-30 | Disposition: A | Discharge status: Home / Self Care | Payer: 59 | Source: Ambulatory Visit | Attending: Sports Medicine | Admitting: Sports Medicine

## 2021-08-30 ENCOUNTER — Other Ambulatory Visit: Payer: Self-pay

## 2021-08-30 DIAGNOSIS — M48061 Spinal stenosis, lumbar region without neurogenic claudication: Secondary | ICD-10-CM

## 2021-08-30 MED ORDER — METHYLPREDNISOLONE ACETATE 40 MG/ML INJ SUSP (RADIOLOG
80.0000 mg | Freq: Once | INTRAMUSCULAR | Status: AC
  Administered 2021-08-30: 80 mg via EPIDURAL

## 2021-08-30 MED ORDER — IOPAMIDOL (ISOVUE-M 200) INJECTION 41%
1.0000 mL | Freq: Once | INTRAMUSCULAR | Status: AC
  Administered 2021-08-30: 1 mL via EPIDURAL

## 2021-08-30 NOTE — Discharge Instructions (Signed)

## 2021-09-13 ENCOUNTER — Ambulatory Visit: Payer: 59 | Admitting: Sports Medicine

## 2021-09-13 ENCOUNTER — Other Ambulatory Visit: Payer: Self-pay

## 2021-09-13 ENCOUNTER — Ambulatory Visit (INDEPENDENT_AMBULATORY_CARE_PROVIDER_SITE_OTHER): Payer: 59

## 2021-09-13 DIAGNOSIS — Z1231 Encounter for screening mammogram for malignant neoplasm of breast: Secondary | ICD-10-CM | POA: Diagnosis not present

## 2021-09-13 DIAGNOSIS — M170 Bilateral primary osteoarthritis of knee: Secondary | ICD-10-CM | POA: Diagnosis not present

## 2021-09-13 MED ORDER — ACETAMINOPHEN ER 650 MG PO TBCR: 650.0000 mg | EXTENDED_RELEASE_TABLET | Freq: Three times a day (TID) | ORAL | 3 refills | Status: DC | PRN

## 2021-09-13 NOTE — Assessment & Plan Note (Signed)
Linda Lam returns again for her left knee, known osteoarthritis, she has had aggressive conservative treatment, multiple injections, last injected early September. She has been referred to [name] for knee replacement, today we did another knee aspiration, she is going to the [name] early January so I am happy to do an aspiration and injection right before her trip. Switching to arthritis from Tylenol, she has really not been diligent with her tramadol. Return as needed.

## 2021-09-13 NOTE — Progress Notes (Signed)
    Procedures performed today:    Procedure: Real-time Ultrasound Guided aspiration of left knee Device: Samsung HS60  Verbal informed consent obtained.  Time-out conducted.  Noted no overlying erythema, induration, or other signs of local infection.  Skin prepped in a sterile fashion.  Local anesthesia: Topical Ethyl chloride.  With sterile technique and under real time ultrasound guidance: Using an 18-gauge needle advanced at the suprapatellar recess, noted effusion and aspirated 15 mils of clear, straw-colored fluid. Completed without difficulty  Advised to call if fevers/chills, erythema, induration, drainage, or persistent bleeding.  Images permanently stored and available for review in PACS.  Impression: Technically successful ultrasound guided injection.  Independent interpretation of notes and tests performed by another provider:   None.  Brief History, Exam, Impression, and Recommendations:    Primary osteoarthritis of both knees Linda Lam returns again for her left knee, known osteoarthritis, she has had aggressive conservative treatment, multiple injections, last injected early September. She has been referred to [name] for knee replacement, today we did another knee aspiration, she is going to the [name] early January so I am happy to do an aspiration and injection right before her trip. Switching to arthritis from Tylenol, she has really not been diligent with her tramadol. Return as needed.    ___________________________________________ Gwen Her. [name], M.D., ABFM., CAQSM. Primary Care and [place] Instructor of [place] of [facility] of [facility]

## 2021-09-14 ENCOUNTER — Ambulatory Visit: Payer: 59 | Admitting: Sports Medicine

## 2022-04-18 ENCOUNTER — Ambulatory Visit: Payer: 59 | Admitting: Sports Medicine

## 2022-04-18 ENCOUNTER — Encounter: Payer: Self-pay | Admitting: Sports Medicine

## 2022-04-18 VITALS — BP 137/79 | HR 68 | Ht 61.0 in | Wt 136.0 lb

## 2022-04-18 DIAGNOSIS — I340 Nonrheumatic mitral (valve) insufficiency: Secondary | ICD-10-CM

## 2022-04-18 DIAGNOSIS — Z0000 Encounter for general adult medical examination without abnormal findings: Secondary | ICD-10-CM

## 2022-04-18 DIAGNOSIS — Z13820 Encounter for screening for osteoporosis: Secondary | ICD-10-CM | POA: Diagnosis not present

## 2022-04-18 NOTE — Progress Notes (Signed)
    Procedures performed today:    None.  Independent interpretation of notes and tests performed by another provider:   None.  Brief History, Exam, Impression, and Recommendations:    Annual physical exam Pleasant 66 year old female, she is here to get some updated labs. She declines Shingrix, declines pneumococcal vaccination and any other vaccinations. Up-to-date on mammography, she will need a DEXA scan. Status post hysterectomy.  Trace mitral regurgitation by prior echocardiogram Getting updated echo.    ___________________________________________ Gwen Her. [name], M.D., ABFM., CAQSM. Primary Care and [place] Instructor of [place] of [facility] of Medicine

## 2022-04-18 NOTE — Assessment & Plan Note (Addendum)
Pleasant 66 year old female, she is here to get some updated labs. She declines Shingrix, declines pneumococcal vaccination and any other vaccinations. Up-to-date on mammography, she will need a DEXA scan. Status post hysterectomy.

## 2022-04-18 NOTE — Assessment & Plan Note (Signed)
Getting updated echo.

## 2022-04-24 ENCOUNTER — Ambulatory Visit (INDEPENDENT_AMBULATORY_CARE_PROVIDER_SITE_OTHER): Payer: 59

## 2022-04-24 DIAGNOSIS — Z13820 Encounter for screening for osteoporosis: Secondary | ICD-10-CM | POA: Diagnosis not present

## 2022-05-07 ENCOUNTER — Ambulatory Visit (HOSPITAL_BASED_OUTPATIENT_CLINIC_OR_DEPARTMENT_OTHER)
Admission: RE | Admit: 2022-05-07 | Discharge: 2022-05-07 | Disposition: A | Discharge status: Home / Self Care | Payer: 59 | Source: Ambulatory Visit | Attending: Sports Medicine | Admitting: Sports Medicine

## 2022-05-07 DIAGNOSIS — I340 Nonrheumatic mitral (valve) insufficiency: Secondary | ICD-10-CM | POA: Diagnosis not present

## 2022-05-07 LAB — ECHOCARDIOGRAM COMPLETE
AR max vel: 1.64 cm2
AV Area VTI: 1.63 cm2
AV Area mean vel: 1.66 cm2
AV Mean grad: 6 mmHg
AV Peak grad: 11.6 mmHg
Ao pk vel: 1.7 m/s
Area-P 1/2: 4.08 cm2
S' Lateral: 2.5 cm

## 2022-05-07 NOTE — Progress Notes (Signed)
  Echocardiogram 2D Echocardiogram has been performed.  Merrie Roof F 05/07/2022, 4:02 PM

## 2022-05-11 ENCOUNTER — Encounter: Payer: Self-pay | Admitting: Sports Medicine

## 2022-05-11 DIAGNOSIS — E782 Mixed hyperlipidemia: Secondary | ICD-10-CM | POA: Insufficient documentation

## 2022-05-11 LAB — TSH: TSH: 1.61 mIU/L (ref 0.40–4.50)

## 2022-05-11 LAB — CBC
HCT: 44.9 % (ref 35.0–45.0)
Hemoglobin: 14.7 g/dL (ref 11.7–15.5)
MCH: 28.5 pg (ref 27.0–33.0)
MCHC: 32.7 g/dL (ref 32.0–36.0)
MCV: 87.2 fL (ref 80.0–100.0)
MPV: 11.8 fL (ref 7.5–12.5)
Platelets: 237 10*3/uL (ref 140–400)
RBC: 5.15 10*6/uL — ABNORMAL HIGH (ref 3.80–5.10)
RDW: 12.7 % (ref 11.0–15.0)
WBC: 4.9 10*3/uL (ref 3.8–10.8)

## 2022-05-11 LAB — COMPLETE METABOLIC PANEL WITH GFR
AG Ratio: 2.2 (calc) (ref 1.0–2.5)
ALT: 9 U/L (ref 6–29)
AST: 14 U/L (ref 10–35)
Albumin: 4.6 g/dL (ref 3.6–5.1)
Alkaline phosphatase (APISO): 98 U/L (ref 37–153)
BUN/Creatinine Ratio: 20 (calc) (ref 6–22)
BUN: 22 mg/dL (ref 7–25)
CO2: 28 mmol/L (ref 20–32)
Calcium: 9.7 mg/dL (ref 8.6–10.4)
Chloride: 105 mmol/L (ref 98–110)
Creat: 1.09 mg/dL — ABNORMAL HIGH (ref 0.50–1.05)
Globulin: 2.1 g/dL (calc) (ref 1.9–3.7)
Glucose, Bld: 88 mg/dL (ref 65–99)
Potassium: 4.8 mmol/L (ref 3.5–5.3)
Sodium: 140 mmol/L (ref 135–146)
Total Bilirubin: 0.5 mg/dL (ref 0.2–1.2)
Total Protein: 6.7 g/dL (ref 6.1–8.1)
eGFR: 56 mL/min/{1.73_m2} — ABNORMAL LOW (ref >=60)

## 2022-05-11 LAB — LIPID PANEL
Cholesterol: 226 mg/dL — ABNORMAL HIGH (ref <200)
HDL: 79 mg/dL (ref >=50)
LDL Cholesterol (Calc): 128 mg/dL (calc) — ABNORMAL HIGH
Non-HDL Cholesterol (Calc): 147 mg/dL (calc) — ABNORMAL HIGH (ref <130)
Total CHOL/HDL Ratio: 2.9 (calc) (ref <5.0)
Triglycerides: 89 mg/dL (ref <150)

## 2022-08-14 IMAGING — MG MM DIGITAL SCREENING BILAT W/ TOMO AND CAD
8 series · 9 of 24 positions shown · non-contrast
Comparison: Previous exam(s).

CLINICAL DATA: Screening.

EXAM:
DIGITAL SCREENING BILATERAL MAMMOGRAM WITH TOMOSYNTHESIS AND CAD
TECHNIQUE: Bilateral screening digital craniocaudal and mediolateral oblique
mammograms were obtained. Bilateral screening digital breast
tomosynthesis was performed. The images were evaluated with
computer-aided detection.

[L MLO synth-2D]
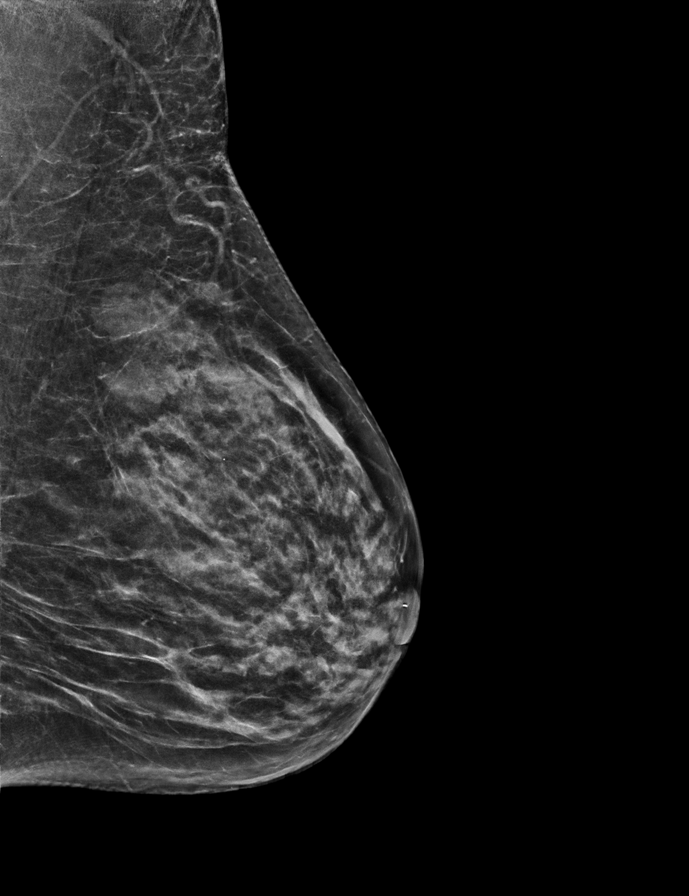

[L CC synth-2D]
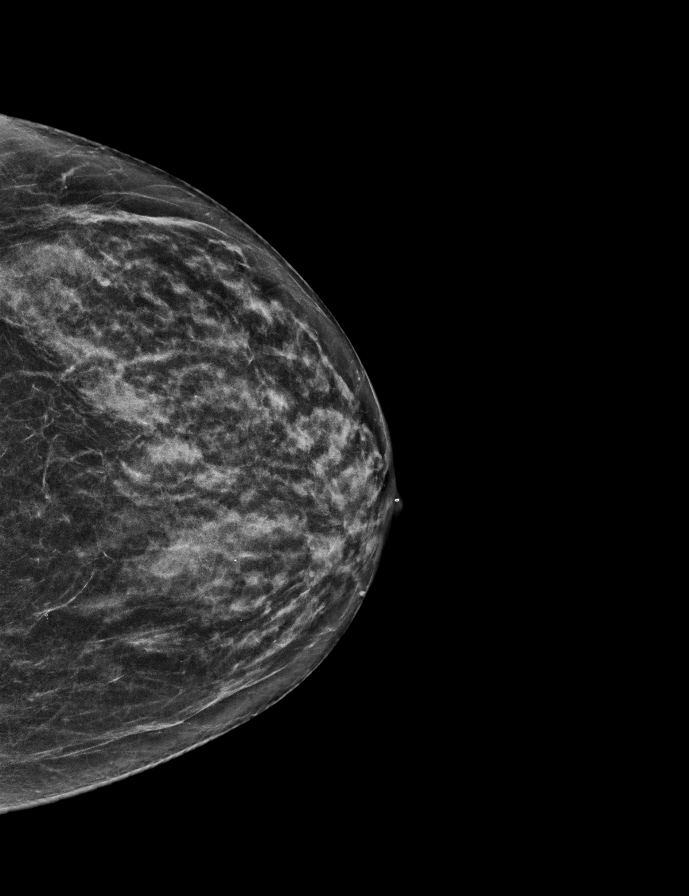

[R MLO synth-2D]
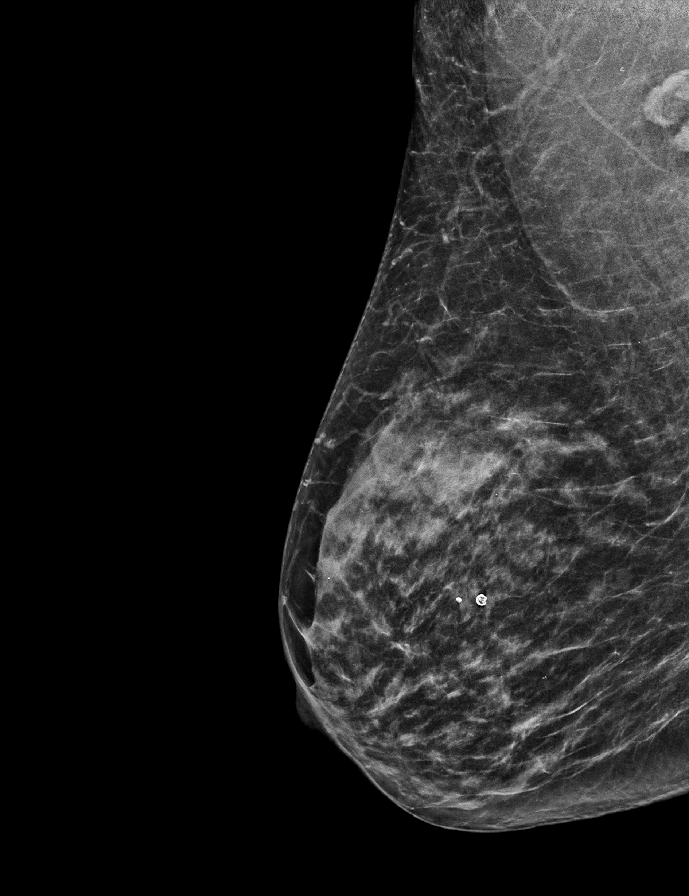

[R CC synth-2D]
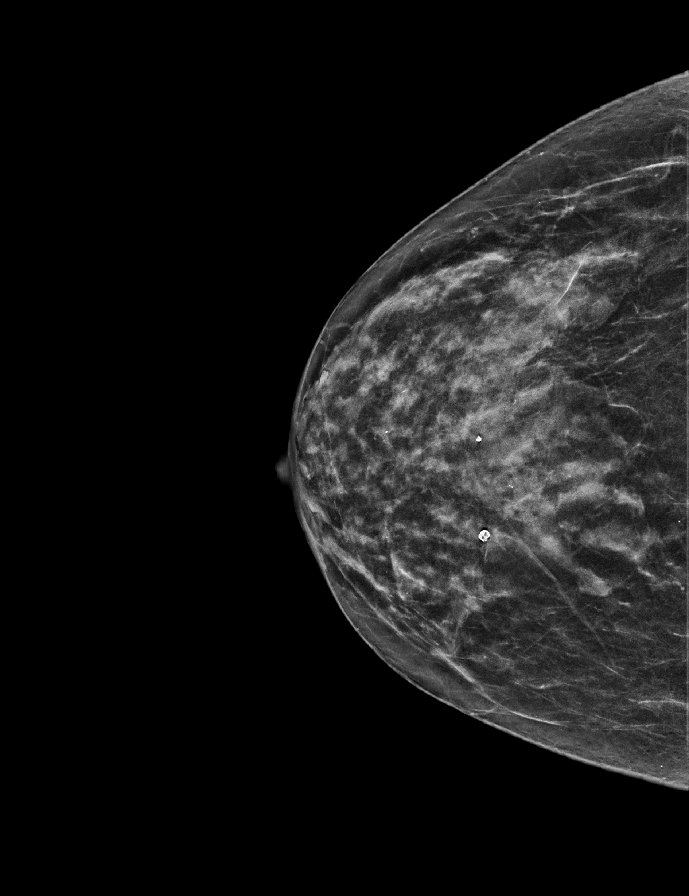

[R MLO tomo · 2 of 55 frames shown]
[frame 18/55]
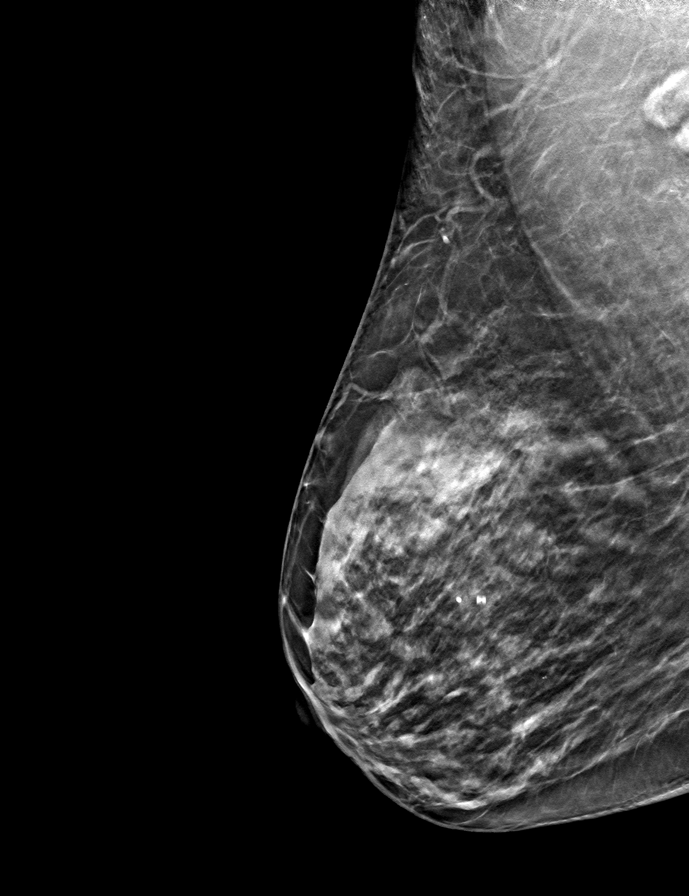
[frame 28/55]
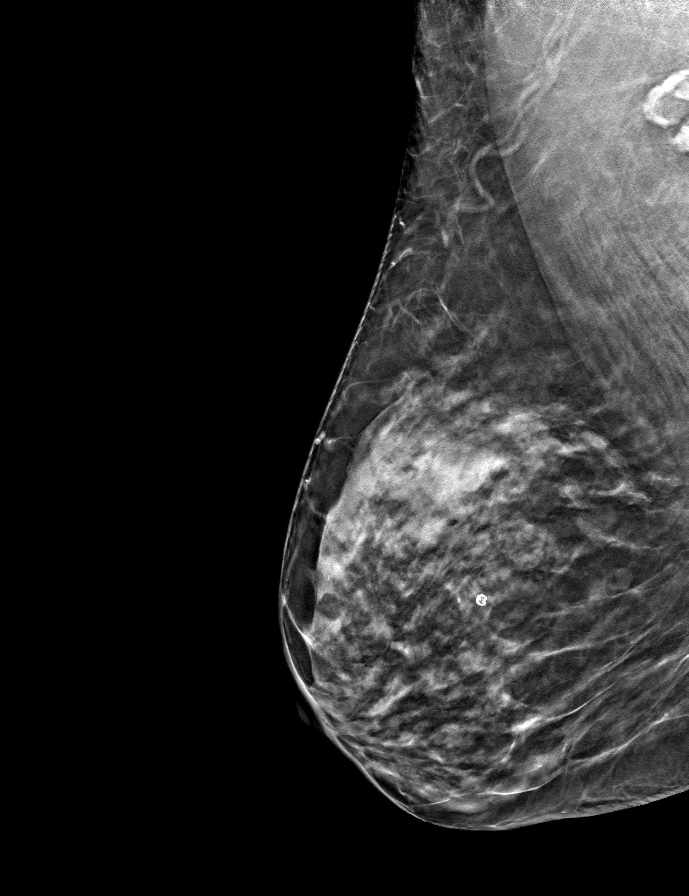

[R CC tomo · tomo slice 26/51.0]
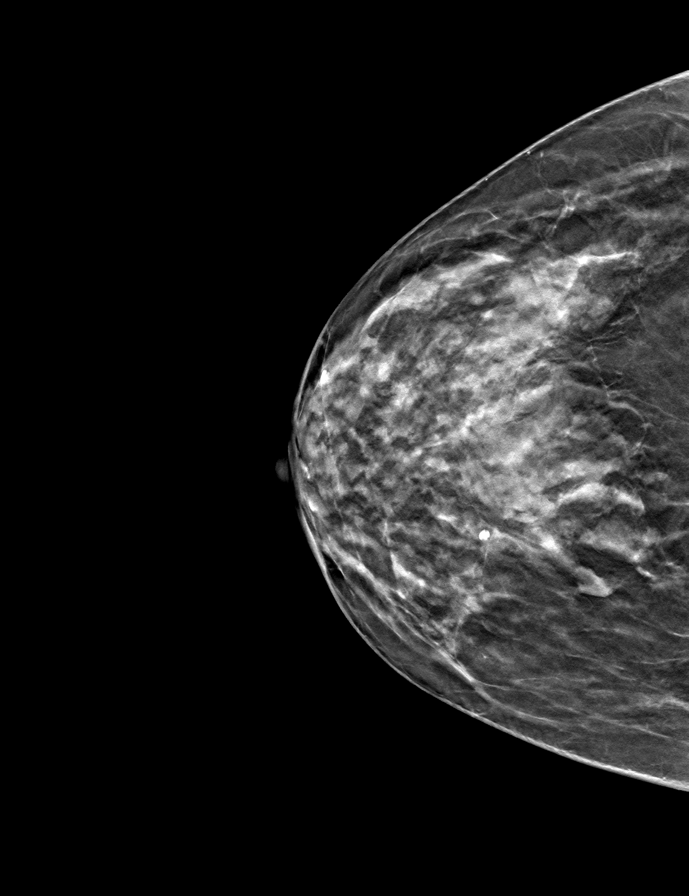

[L CC tomo · tomo slice 27/52.0]
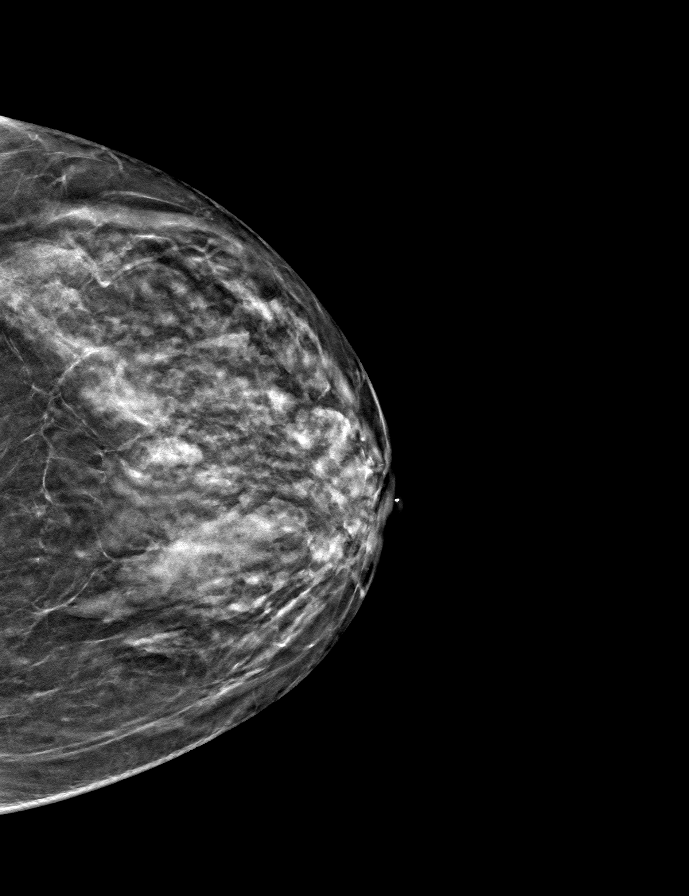

[L MLO tomo · tomo slice 27/53.0]
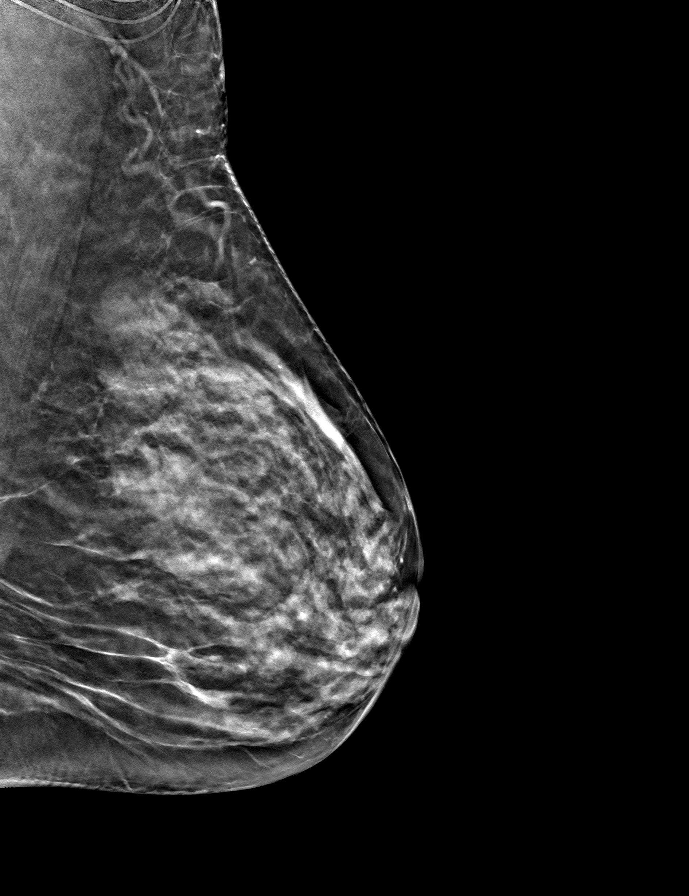

[9 of 24 positions shown; findings below may reference images not displayed]

ACR Breast Density Category c: The breast tissue is heterogeneously
dense, which may obscure small masses.
FINDINGS: There are no findings suspicious for malignancy.
IMPRESSION: No mammographic evidence of malignancy. A result letter of this
screening mammogram will be mailed directly to the patient.

RECOMMENDATION:
Screening mammogram in one year. (Code:Q3-W-BC3)

BI-RADS CATEGORY  1: Negative.

## 2022-08-20 ENCOUNTER — Other Ambulatory Visit: Payer: Self-pay | Admitting: Sports Medicine

## 2022-08-20 DIAGNOSIS — Z1231 Encounter for screening mammogram for malignant neoplasm of breast: Secondary | ICD-10-CM

## 2022-09-05 ENCOUNTER — Telehealth: Payer: Self-pay

## 2022-09-05 MED ORDER — ERYTHROMYCIN 5 MG/GM OP OINT: 1.0000 | TOPICAL_OINTMENT | Freq: Three times a day (TID) | OPHTHALMIC | 0 refills | Status: AC

## 2022-09-05 NOTE — Telephone Encounter (Signed)
Patient called to request a refill of erythromycin ophth ointment for a stye.

## 2022-09-05 NOTE — Telephone Encounter (Signed)
Refill sent.

## 2022-09-19 ENCOUNTER — Ambulatory Visit (INDEPENDENT_AMBULATORY_CARE_PROVIDER_SITE_OTHER): Payer: 59

## 2022-09-19 DIAGNOSIS — Z1231 Encounter for screening mammogram for malignant neoplasm of breast: Secondary | ICD-10-CM

## 2022-10-02 ENCOUNTER — Encounter (HOSPITAL_BASED_OUTPATIENT_CLINIC_OR_DEPARTMENT_OTHER): Payer: Self-pay | Admitting: Orthopedic Surgery

## 2022-10-02 ENCOUNTER — Other Ambulatory Visit: Payer: Self-pay

## 2022-10-07 ENCOUNTER — Other Ambulatory Visit (HOSPITAL_COMMUNITY): Payer: Self-pay | Admitting: Orthopedic Surgery

## 2022-10-10 ENCOUNTER — Other Ambulatory Visit: Payer: Self-pay

## 2022-10-10 ENCOUNTER — Ambulatory Visit (HOSPITAL_BASED_OUTPATIENT_CLINIC_OR_DEPARTMENT_OTHER)
Admission: RE | Admit: 2022-10-10 | Discharge: 2022-10-10 | Disposition: A | Discharge status: Home / Self Care | Payer: 59 | Attending: Orthopedic Surgery | Admitting: Orthopedic Surgery

## 2022-10-10 ENCOUNTER — Encounter (HOSPITAL_BASED_OUTPATIENT_CLINIC_OR_DEPARTMENT_OTHER)
Admission: RE | Disposition: A | Discharge status: Home / Self Care | Payer: Self-pay | Source: Home / Self Care | Attending: Orthopedic Surgery

## 2022-10-10 ENCOUNTER — Ambulatory Visit (HOSPITAL_BASED_OUTPATIENT_CLINIC_OR_DEPARTMENT_OTHER): Payer: 59

## 2022-10-10 ENCOUNTER — Encounter (HOSPITAL_BASED_OUTPATIENT_CLINIC_OR_DEPARTMENT_OTHER): Payer: Self-pay | Admitting: Orthopedic Surgery

## 2022-10-10 ENCOUNTER — Ambulatory Visit (HOSPITAL_BASED_OUTPATIENT_CLINIC_OR_DEPARTMENT_OTHER): Payer: 59 | Admitting: Anesthesiology

## 2022-10-10 DIAGNOSIS — Z87891 Personal history of nicotine dependence: Secondary | ICD-10-CM | POA: Diagnosis not present

## 2022-10-10 DIAGNOSIS — M7741 Metatarsalgia, right foot: Secondary | ICD-10-CM | POA: Insufficient documentation

## 2022-10-10 DIAGNOSIS — M21611 Bunion of right foot: Secondary | ICD-10-CM

## 2022-10-10 DIAGNOSIS — M2041 Other hammer toe(s) (acquired), right foot: Secondary | ICD-10-CM | POA: Insufficient documentation

## 2022-10-10 DIAGNOSIS — Z01818 Encounter for other preprocedural examination: Secondary | ICD-10-CM

## 2022-10-10 HISTORY — DX: Unspecified osteoarthritis, unspecified site: M19.90

## 2022-10-10 HISTORY — PX: HAMMERTOE RECONSTRUCTION WITH WEIL OSTEOTOMY: SHX5631

## 2022-10-10 HISTORY — PX: BUNIONECTOMY: SHX129

## 2022-10-10 HISTORY — PX: AIKEN OSTEOTOMY: SHX6331

## 2022-10-10 SURGERY — BUNIONECTOMY
Anesthesia: General | Site: Foot | Laterality: Right

## 2022-10-10 MED ORDER — ACETAMINOPHEN 500 MG PO TABS
ORAL_TABLET | ORAL | Status: AC
  Filled 2022-10-10: qty 2

## 2022-10-10 MED ORDER — CEFAZOLIN SODIUM-DEXTROSE 2-4 GM/100ML-% IV SOLN
2.0000 g | INTRAVENOUS | Status: AC
  Administered 2022-10-10: 2 g via INTRAVENOUS

## 2022-10-10 MED ORDER — PROPOFOL 500 MG/50ML IV EMUL
INTRAVENOUS | Status: DC | PRN
  Administered 2022-10-10: 25 ug/kg/min via INTRAVENOUS

## 2022-10-10 MED ORDER — DOCUSATE SODIUM 100 MG PO CAPS: 100.0000 mg | ORAL_CAPSULE | Freq: Two times a day (BID) | ORAL | 0 refills | Status: DC

## 2022-10-10 MED ORDER — PROPOFOL 10 MG/ML IV BOLUS
INTRAVENOUS | Status: DC | PRN
  Administered 2022-10-10: 120 mg via INTRAVENOUS

## 2022-10-10 MED ORDER — OXYCODONE HCL 5 MG PO TABS: 5.0000 mg | ORAL_TABLET | Freq: Once | ORAL | Status: DC | PRN

## 2022-10-10 MED ORDER — BUPIVACAINE-EPINEPHRINE (PF) 0.5% -1:200000 IJ SOLN
INTRAMUSCULAR | Status: DC | PRN
  Administered 2022-10-10: 10 mL via PERINEURAL
  Administered 2022-10-10: 20 mL via PERINEURAL

## 2022-10-10 MED ORDER — EPHEDRINE SULFATE (PRESSORS) 50 MG/ML IJ SOLN
INTRAMUSCULAR | Status: DC | PRN
  Administered 2022-10-10: 10 mg via INTRAVENOUS

## 2022-10-10 MED ORDER — DEXAMETHASONE SODIUM PHOSPHATE 10 MG/ML IJ SOLN
INTRAMUSCULAR | Status: DC | PRN
  Administered 2022-10-10: 4 mg via INTRAVENOUS

## 2022-10-10 MED ORDER — OXYCODONE HCL 5 MG/5ML PO SOLN: 5.0000 mg | Freq: Once | ORAL | Status: DC | PRN

## 2022-10-10 MED ORDER — FENTANYL CITRATE (PF) 100 MCG/2ML IJ SOLN: 25.0000 ug | INTRAMUSCULAR | Status: DC | PRN

## 2022-10-10 MED ORDER — 0.9 % SODIUM CHLORIDE (POUR BTL) OPTIME
TOPICAL | Status: DC | PRN
  Administered 2022-10-10: 120 mL

## 2022-10-10 MED ORDER — PHENYLEPHRINE HCL (PRESSORS) 10 MG/ML IV SOLN
INTRAVENOUS | Status: DC | PRN
  Administered 2022-10-10 (×2): 80 ug via INTRAVENOUS

## 2022-10-10 MED ORDER — SODIUM CHLORIDE 0.9 % IV SOLN
INTRAVENOUS | Status: AC | PRN
  Administered 2022-10-10: 100 mL

## 2022-10-10 MED ORDER — LIDOCAINE 2% (20 MG/ML) 5 ML SYRINGE
INTRAMUSCULAR | Status: DC | PRN
  Administered 2022-10-10: 60 mg via INTRAVENOUS

## 2022-10-10 MED ORDER — OXYCODONE HCL 5 MG PO TABS: 5.0000 mg | ORAL_TABLET | Freq: Four times a day (QID) | ORAL | 0 refills | Status: AC | PRN

## 2022-10-10 MED ORDER — CLONIDINE HCL (ANALGESIA) 100 MCG/ML EP SOLN
EPIDURAL | Status: DC | PRN
  Administered 2022-10-10: 67 ug
  Administered 2022-10-10: 33 ug

## 2022-10-10 MED ORDER — ACETAMINOPHEN 500 MG PO TABS: 1000.0000 mg | ORAL_TABLET | Freq: Once | ORAL | Status: DC

## 2022-10-10 MED ORDER — CEFAZOLIN SODIUM-DEXTROSE 2-4 GM/100ML-% IV SOLN
INTRAVENOUS | Status: AC
  Filled 2022-10-10: qty 100

## 2022-10-10 MED ORDER — FENTANYL CITRATE (PF) 100 MCG/2ML IJ SOLN
100.0000 ug | Freq: Once | INTRAMUSCULAR | Status: AC
  Administered 2022-10-10: 100 ug via INTRAVENOUS

## 2022-10-10 MED ORDER — SENNA 8.6 MG PO TABS: 2.0000 | ORAL_TABLET | Freq: Two times a day (BID) | ORAL | 0 refills | Status: DC

## 2022-10-10 MED ORDER — BUPIVACAINE HCL (PF) 0.5 % IJ SOLN
INTRAMUSCULAR | Status: AC
  Filled 2022-10-10: qty 30

## 2022-10-10 MED ORDER — LACTATED RINGERS IV SOLN: INTRAVENOUS | Status: DC

## 2022-10-10 MED ORDER — MIDAZOLAM HCL 2 MG/2ML IJ SOLN
2.0000 mg | Freq: Once | INTRAMUSCULAR | Status: AC
  Administered 2022-10-10: 2 mg via INTRAVENOUS

## 2022-10-10 MED ORDER — AMISULPRIDE (ANTIEMETIC) 5 MG/2ML IV SOLN: 10.0000 mg | Freq: Once | INTRAVENOUS | Status: DC | PRN

## 2022-10-10 MED ORDER — SODIUM CHLORIDE 0.9 % IV SOLN: INTRAVENOUS | Status: DC

## 2022-10-10 MED ORDER — MIDAZOLAM HCL 2 MG/2ML IJ SOLN
INTRAMUSCULAR | Status: AC
  Filled 2022-10-10: qty 2

## 2022-10-10 MED ORDER — FENTANYL CITRATE (PF) 100 MCG/2ML IJ SOLN
INTRAMUSCULAR | Status: AC
  Filled 2022-10-10: qty 2

## 2022-10-10 MED ORDER — ONDANSETRON HCL 4 MG/2ML IJ SOLN
INTRAMUSCULAR | Status: DC | PRN
  Administered 2022-10-10: 4 mg via INTRAVENOUS

## 2022-10-10 MED ORDER — VANCOMYCIN HCL 500 MG IV SOLR
INTRAVENOUS | Status: AC
  Filled 2022-10-10: qty 10

## 2022-10-10 SURGICAL SUPPLY — 104 items
APL PRP STRL LF DISP 70% ISPRP (MISCELLANEOUS) ×1
BANDAGE ESMARK 6X9 LF (GAUZE/BANDAGES/DRESSINGS) IMPLANT
BIT DRILL 1.8 CANN MAX VPC (BIT) IMPLANT
BIT DRILL CALIB COMP FT 2.2 (BIT) IMPLANT
BIT DRILL CANN 2.9 (BIT) IMPLANT
BIT DRILL CANN 3.6 (BIT) IMPLANT
BLADE AVERAGE 25X9 (BLADE) IMPLANT
BLADE LONG MED 25X9 (BLADE) IMPLANT
BLADE MICRO SAGITTAL (BLADE) IMPLANT
BLADE MINI RND TIP GREEN BEAV (BLADE) IMPLANT
BLADE OSC/SAG .038X5.5 CUT EDG (BLADE) IMPLANT
BLADE SURG 15 STRL LF DISP TIS (BLADE) ×2 IMPLANT
BLADE SURG 15 STRL SS (BLADE) ×3
BNDG CMPR 75X21 PLY HI ABS (MISCELLANEOUS)
BNDG CMPR 9X4 STRL LF SNTH (GAUZE/BANDAGES/DRESSINGS)
BNDG CMPR 9X6 STRL LF SNTH (GAUZE/BANDAGES/DRESSINGS)
BNDG ELASTIC 4X5.8 VLCR STR LF (GAUZE/BANDAGES/DRESSINGS) ×1 IMPLANT
BNDG ELASTIC 6X5.8 VLCR STR LF (GAUZE/BANDAGES/DRESSINGS) IMPLANT
BNDG ESMARK 4X9 LF (GAUZE/BANDAGES/DRESSINGS) IMPLANT
BNDG ESMARK 6X9 LF (GAUZE/BANDAGES/DRESSINGS)
BNDG GZE 12X3 1 PLY HI ABS (GAUZE/BANDAGES/DRESSINGS) ×1
BNDG STRETCH GAUZE 3IN X12FT (GAUZE/BANDAGES/DRESSINGS) ×1 IMPLANT
BUR MIS CONICAL WEDGE 4.3X13 (BUR) IMPLANT
BUR MIS STRT 2.0X13 (BUR) IMPLANT
BUR MIS STRT 2.0X19.5 (BUR) IMPLANT
BURR 2X19.5 (BUR) ×1
BURR CONICAL 4.3 (BUR)
BURR MIS CONICAL WEDGE 4.3X13 (BUR)
BURR MIS STRT 2.0X13 (BUR) ×1
BURR MIS STRT 2.0X19.5 (BUR) ×1
BURR SHAVER STRAIGHT 2.0X13 (BUR) ×1
CAP PIN PROTECTOR ORTHO WHT (CAP) IMPLANT
CHLORAPREP W/TINT 26 (MISCELLANEOUS) ×1 IMPLANT
COVER BACK TABLE 60X90IN (DRAPES) ×1 IMPLANT
CUFF TOURN SGL QUICK 24 (TOURNIQUET CUFF)
CUFF TOURN SGL QUICK 34 (TOURNIQUET CUFF)
CUFF TRNQT CYL 24X4X16.5-23 (TOURNIQUET CUFF) IMPLANT
CUFF TRNQT CYL 34X4.125X (TOURNIQUET CUFF) IMPLANT
DRAPE EXTREMITY T 121X128X90 (DISPOSABLE) ×1 IMPLANT
DRAPE OEC MINIVIEW 54X84 (DRAPES) ×1 IMPLANT
DRAPE U-SHAPE 47X51 STRL (DRAPES) ×1 IMPLANT
DRSG MEPITEL 4X7.2 (GAUZE/BANDAGES/DRESSINGS) ×1 IMPLANT
ELECT REM PT RETURN 9FT ADLT (ELECTROSURGICAL) ×1
ELECTRODE REM PT RTRN 9FT ADLT (ELECTROSURGICAL) ×1 IMPLANT
GAUZE PAD ABD 8X10 STRL (GAUZE/BANDAGES/DRESSINGS) ×1 IMPLANT
GAUZE SPONGE 4X4 12PLY STRL (GAUZE/BANDAGES/DRESSINGS) ×1 IMPLANT
GAUZE STRETCH 2X75IN STRL (MISCELLANEOUS) IMPLANT
GLOVE BIOGEL PI IND STRL 7.0 (GLOVE) IMPLANT
GLOVE BIOGEL PI IND STRL 8 (GLOVE) ×2 IMPLANT
GLOVE SURG SS PI 7.0 STRL IVOR (GLOVE) IMPLANT
GLOVE SURG SYN 8.0 (GLOVE) ×2 IMPLANT
GLOVE SURG SYN 8.0 PF PI (GLOVE) ×2 IMPLANT
GOWN STRL REUS W/ TWL LRG LVL3 (GOWN DISPOSABLE) ×1 IMPLANT
GOWN STRL REUS W/ TWL XL LVL3 (GOWN DISPOSABLE) ×2 IMPLANT
GOWN STRL REUS W/TWL LRG LVL3 (GOWN DISPOSABLE) ×1
GOWN STRL REUS W/TWL XL LVL3 (GOWN DISPOSABLE) ×2
GUIDEWIRE 1.6X150 (WIRE) IMPLANT
GUIDEWIRE BEVELED FT 1.4X3.5 (WIRE) IMPLANT
GUIDEWIRE BEVELED FT 1.6X4 (WIRE) IMPLANT
K-WIRE COCR 0.9X95 (WIRE) ×1
K-WIRE DBL .054X4 NSTRL (WIRE)
K-WIRE DBL .054X9 NSTRL (WIRE)
KWIRE COCR 0.9X95 (WIRE) IMPLANT
KWIRE DBL .054X4 NSTRL (WIRE) IMPLANT
KWIRE DBL .054X9 NSTRL (WIRE) ×1 IMPLANT
NDL HYPO 25X1 1.5 SAFETY (NEEDLE) IMPLANT
NEEDLE HYPO 22GX1.5 SAFETY (NEEDLE) IMPLANT
NEEDLE HYPO 25X1 1.5 SAFETY (NEEDLE) IMPLANT
NS IRRIG 1000ML POUR BTL (IV SOLUTION) ×1 IMPLANT
PACK BASIN DAY SURGERY FS (CUSTOM PROCEDURE TRAY) ×1 IMPLANT
PAD CAST 4YDX4 CTTN HI CHSV (CAST SUPPLIES) ×1 IMPLANT
PADDING CAST ABS COTTON 4X4 ST (CAST SUPPLIES) IMPLANT
PADDING CAST COTTON 4X4 STRL (CAST SUPPLIES) ×1
PADDING CAST COTTON 6X4 STRL (CAST SUPPLIES) IMPLANT
PASSER SUT SWANSON 36MM LOOP (INSTRUMENTS) IMPLANT
PENCIL SMOKE EVACUATOR (MISCELLANEOUS) ×1 IMPLANT
SANITIZER HAND PURELL FF 515ML (MISCELLANEOUS) ×1 IMPLANT
SCREW BEVELED 3.5X34 (Screw) IMPLANT
SCREW BEVELED 4.0X48 (Screw) IMPLANT
SCREW COMPRESSION 2.5X24 (Screw) IMPLANT
SCREW COMPRESSION 2.5X25 (Screw) ×1 IMPLANT
SCREW HCS TWIST-OFF 2.0X12MM (Screw) IMPLANT
SCREW VPC 2.5X16MM (Screw) IMPLANT
SET IRRIGATION 3.8 (SET/KITS/TRAYS/PACK) IMPLANT
SHEET MEDIUM DRAPE 40X70 STRL (DRAPES) ×1 IMPLANT
SLEEVE SCD COMPRESS KNEE MED (STOCKING) ×1 IMPLANT
SPLINT PLASTER CAST FAST 5X30 (CAST SUPPLIES) IMPLANT
SPONGE T-LAP 18X18 ~~LOC~~+RFID (SPONGE) ×1 IMPLANT
STOCKINETTE 6  STRL (DRAPES) ×1
STOCKINETTE 6 STRL (DRAPES) ×1 IMPLANT
SUCTION FRAZIER HANDLE 10FR (MISCELLANEOUS) ×1
SUCTION TUBE FRAZIER 10FR DISP (MISCELLANEOUS) ×1 IMPLANT
SUT ETHILON 3 0 PS 1 (SUTURE) ×1 IMPLANT
SUT MNCRL AB 3-0 PS2 18 (SUTURE) ×1 IMPLANT
SUT VIC AB 2-0 SH 27 (SUTURE)
SUT VIC AB 2-0 SH 27XBRD (SUTURE) IMPLANT
SUT VICRYL 0 SH 27 (SUTURE) IMPLANT
SUT VICRYL 0 UR6 27IN ABS (SUTURE) IMPLANT
SYR BULB EAR ULCER 3OZ GRN STR (SYRINGE) ×1 IMPLANT
SYR CONTROL 10ML LL (SYRINGE) IMPLANT
TOWEL GREEN STERILE FF (TOWEL DISPOSABLE) ×2 IMPLANT
TUBE CONNECTING 20X1/4 (TUBING) ×1 IMPLANT
UNDERPAD 30X36 HEAVY ABSORB (UNDERPADS AND DIAPERS) ×1 IMPLANT
YANKAUER SUCT BULB TIP NO VENT (SUCTIONS) IMPLANT

## 2022-10-10 NOTE — Op Note (Signed)
10/10/2022  12:24 PM  PATIENT:  Linda Lam  66 y.o. female  PRE-OPERATIVE DIAGNOSIS:  1.  Right foot bunion 2.  Right forefoot metatarsalgia 3.  Right second hammertoe deformity  POST-OPERATIVE DIAGNOSIS: Same  Procedure(s): 1.  Right foot bunion correction with double osteotomy (Aiken/chevron) 2.  Percutaneous second toe FDL tenotomy 3.  Right second hammertoe correction 4.  Right second metatarsal Weil osteotomy 5.  Right foot AP, lateral and oblique radiographs  SURGEON:  Wylene Simmer, MD  ASSISTANT: Mechele Claude, PA-C  ANESTHESIA:   General, regional  EBL:  minimal   TOURNIQUET: None  COMPLICATIONS:  None apparent  DISPOSITION:  Extubated, awake and stable to recovery.  INDICATION FOR PROCEDURE: 66 year old female with a painful recurrent right foot bunion deformity.  She also has metatarsalgia and a second hammertoe.  She presents today for surgical treatment of these painful and limiting conditions having failed nonoperative treatment to date.  The risks and benefits of the alternative treatment options have been discussed in detail.  The patient wishes to proceed with surgery and specifically understands risks of bleeding, infection, nerve damage, blood clots, need for additional surgery, amputation and death.   PROCEDURE IN DETAIL:  The risks and benefits of the alternative treatment options have been discussed in detail.  The patient wishes to proceed with surgery and specifically understands risks of bleeding, infection, nerve damage, blood clots, need for additional surgery, amputation and death.  The right lower extremity was prepped and draped in standard sterile fashion.  The contours of the first metatarsal were marked on the skin and ankle along with the midline identified on a lateral radiograph.  A small incision was made at the metatarsal neck just proximal to the sesamoids.  Subperiosteal dissection was carried dorsally and plantarly.  The 2 x 20 mm  Shannon bur was used to cut the metatarsal angling slightly distally to avoid shortening.  The cut was mobilized with the Arthrex translator.  The guidepin was placed in subperiosteal fashion at the head of the metatarsal.  The proximal end was placed in the metatarsal shaft.  Radiographs confirmed appropriate translation of the metatarsal head correcting the intermetatarsal and hallux valgus angles.  The guide was then assembled.  A guidepin was inserted from the medial base of the first metatarsal across the lateral cortex and into the lateral first metatarsal head.  AP and lateral radiographs confirmed appropriate position of the guidepin.  A second parallel guide pin was inserted distal and medial from the first.  Radiographs confirmed appropriate position of both guidepins.  The lateral guide pin was overdrilled and a fully threaded Arthrex 4 mm screw was inserted.  It was noted to have excellent purchase and was seated appropriately at the medial cortex.  A 3 mm screw was then inserted over the lateral guidewire in the same fashion.  The guides and pins were removed.  Radiographs confirmed appropriate correction of the intermetatarsal and hallux valgus angles and appropriate position and length of both screws.  The overhanging bone medially was then trimmed with the bur.  Attention was turned to the hallux proximal phalanx.  An incision was made at the medial midshaft.  Subperiosteal dissection was carried dorsally and plantarly.  An Aiken osteotomy was then made with the 2 x 12 mm Shannon bur.  The osteotomy was closed down and a guidepin inserted obliquely.  Radiographs confirmed appropriate correction of residual hallux valgus interphalangeus.  A 3 mm cannulated screw was then placed over the guidewire  after predrilling.  Attention was turned to the second toe.  A percutaneous flexor digitorum longus tenotomy was performed at the distal flexion crease.  A longitudinal incision was made over the second  MTP joint.  Dissection was carried sharply down through the subcutaneous tissues.  The dorsal joint capsule was incised and elevated medially and laterally.  The osteotomy and the second metatarsal head was made with the [facility] bur.  The head was allowed to translate proximally a few millimeters.  It was fixed with a 2 mm FRS screw.  A transverse incision was then made over the PIP joint.  Dissection was carried down through the extensor mechanism.  The head of the proximal phalanx was resected followed by the base of the middle phalanx.  The joint was reduced and fixed with a 2.5 mm [employer] VPC screw.  Final AP, lateral and oblique radiographs confirmed appropriate correction of the bunion deformity and hammer toe deformity.  Hardware is appropriately positioned and of the appropriate lengths.  No other acute injuries are noted.  The wounds were irrigated copiously and closed with nylon.  Sterile dressings were applied followed by a bunion wrap.  The patient was awakened from anesthesia and transported to the recovery room in stable condition.  FOLLOW UP PLAN: Weightbearing on the heel in a Darco shoe.  Follow-up in the office in 2 weeks for suture removal and a toe spacer.  Consider flat postop shoe at the next visit.   RADIOGRAPHS: AP, lateral and oblique radiographs of the right foot are obtained intraoperatively.  These show interval correction of the bunion deformity with osteotomy of the first metatarsal and hallux proximal phalanx.  Also noted shortening of the second metatarsal and hammertoe correction with PIP joint arthrodesis.  Hardware is appropriately positioned and of the appropriate lengths.  No other acute injuries are noted.    Mechele Claude PA-C was present and scrubbed for the duration of the operative case. His assistance was essential in positioning the patient, prepping and draping, gaining and maintaining exposure, performing the operation, closing and dressing the wounds  and applying the splint.

## 2022-10-10 NOTE — Anesthesia Procedure Notes (Signed)
Anesthesia Regional Block: Adductor canal block   Pre-Anesthetic Checklist: , timeout performed,  Correct Patient, Correct Site, Correct Laterality,  Correct Procedure, Correct Position, site marked,  Risks and benefits discussed,  Pre-op evaluation,  At surgeon's request and post-op pain management  Laterality: Right  Prep: Maximum Sterile Barrier Precautions used, chloraprep       Needles:  Injection technique: Single-shot  Needle Type: Echogenic Stimulator Needle     Needle Length: 9cm  Needle Gauge: 22     Additional Needles:   Procedures:,,,, ultrasound used (permanent image in chart),,    Narrative:  Start time: 10/10/2022 9:55 AM End time: 10/10/2022 9:58 AM Injection made incrementally with aspirations every 5 mL.  Performed by: Personally  Anesthesiologist: Brennan Bailey, MD  Additional Notes: Risks, benefits, and alternative discussed. Patient gave consent for procedure. Patient prepped and draped in sterile fashion. Sedation administered, patient remains easily responsive to voice. Relevant anatomy identified with ultrasound guidance. Local anesthetic given in 5cc increments with no signs or symptoms of intravascular injection. No pain or paraesthesias with injection. Patient monitored throughout procedure with signs of LAST or immediate complications. Tolerated well. Ultrasound image placed in chart.  Tawny Asal, MD

## 2022-10-10 NOTE — Anesthesia Preprocedure Evaluation (Addendum)
Anesthesia Evaluation  Patient identified by MRN, date of birth, ID band Patient awake    Reviewed: Allergy & Precautions, NPO status , Patient's Chart, lab work & pertinent test results  History of Anesthesia Complications Negative for: history of anesthetic complications  Airway Mallampati: I  TM Distance: >3 FB Neck ROM: Full    Dental no notable dental hx.    Pulmonary Patient abstained from smoking., former smoker   Pulmonary exam normal        Cardiovascular negative cardio ROS Normal cardiovascular exam     Neuro/Psych    GI/Hepatic negative GI ROS, Neg liver ROS,,,  Endo/Other  negative endocrine ROS    Renal/GU negative Renal ROS     Musculoskeletal  (+) Arthritis ,  right foot hammertoe   Abdominal   Peds  Hematology negative hematology ROS (+)   Anesthesia Other Findings Day of surgery medications reviewed with patient.  Reproductive/Obstetrics                              Anesthesia Physical Anesthesia Plan  ASA: 2  Anesthesia Plan: General   Post-op Pain Management: Tylenol PO (pre-op)* and Regional block*   Induction: Intravenous  PONV Risk Score and Plan: 3 and Treatment may vary due to age or medical condition, Ondansetron, Dexamethasone and Midazolam  Airway Management Planned: LMA  Additional Equipment: None  Intra-op Plan:   Post-operative Plan: Extubation in OR  Informed Consent: I have reviewed the patients History and Physical, chart, labs and discussed the procedure including the risks, benefits and alternatives for the proposed anesthesia with the patient or authorized representative who has indicated his/her understanding and acceptance.     Dental advisory given  Plan Discussed with: CRNA  Anesthesia Plan Comments:         Anesthesia Quick Evaluation

## 2022-10-10 NOTE — Anesthesia Procedure Notes (Signed)
Anesthesia Regional Block: Popliteal block   Pre-Anesthetic Checklist: , timeout performed,  Correct Patient, Correct Site, Correct Laterality,  Correct Procedure, Correct Position, site marked,  Risks and benefits discussed,  Pre-op evaluation,  At surgeon's request and post-op pain management  Laterality: Right  Prep: Maximum Sterile Barrier Precautions used, chloraprep       Needles:  Injection technique: Single-shot  Needle Type: Echogenic Stimulator Needle     Needle Length: 9cm  Needle Gauge: 22     Additional Needles:   Procedures:,,,, ultrasound used (permanent image in chart),,    Narrative:  Start time: 10/10/2022 9:58 AM End time: 10/10/2022 10:00 AM Injection made incrementally with aspirations every 5 mL.  Performed by: Personally  Anesthesiologist: Brennan Bailey, MD  Additional Notes: Risks, benefits, and alternative discussed. Patient gave consent for procedure. Patient prepped and draped in sterile fashion. Sedation administered, patient remains easily responsive to voice. Relevant anatomy identified with ultrasound guidance. Local anesthetic given in 5cc increments with no signs or symptoms of intravascular injection. No pain or paraesthesias with injection. Patient monitored throughout procedure with signs of LAST or immediate complications. Tolerated well. Ultrasound image placed in chart.  Tawny Asal, MD

## 2022-10-10 NOTE — Discharge Instructions (Signed)
Linda Simmer, MD [facility]  Please read the following information regarding your care after surgery.  Medications  You only need a prescription for the narcotic pain medicine (ex. oxycodone, Percocet, Norco).  All of the other medicines listed below are available over the counter. ? Aleve 2 pills twice a day for the first 3 days after surgery. ? acetominophen (Tylenol) 650 mg every 4-6 hours as you need for minor to moderate pain ? oxycodone as prescribed for severe pain  Narcotic pain medicine (ex. oxycodone, Percocet, Vicodin) will cause constipation.  To prevent this problem, take the following medicines while you are taking any pain medicine. ? docusate sodium (Colace) 100 mg twice a day ? senna (Senokot) 2 tablets twice a day   Weight Bearing ? Bear weight only on your operated foot, on your heel, in the post-op shoe.   Cast / Splint / Dressing ? Keep your splint, cast or dressing clean and dry.  Don't put anything (coat hanger, pencil, etc) down inside of it.  If it gets damp, use a hair dryer on the cool setting to dry it.  If it gets soaked, call the office to schedule an appointment for a cast change.   After your dressing, cast or splint is removed; you may shower, but do not soak or scrub the wound.  Allow the water to run over it, and then gently pat it dry.  Swelling It is normal for you to have swelling where you had surgery.  To reduce swelling and pain, keep your toes above your nose for at least 3 days after surgery.  It may be necessary to keep your foot or leg elevated for several weeks.  If it hurts, it should be elevated.  Follow Up Call my office at [phone] when you are discharged from the hospital or surgery center to schedule an appointment to be seen two weeks after surgery.  Call my office at [phone] if you develop a fever >101.5 F, nausea, vomiting, bleeding from the surgical site or severe pain.

## 2022-10-10 NOTE — Progress Notes (Signed)
Assisted Dr. Daiva Huge with right, adductor canal and popliteal, ultrasound guided block. Side rails up, monitors on throughout procedure. See vital signs in flow sheet. Tolerated Procedure well.

## 2022-10-10 NOTE — Anesthesia Postprocedure Evaluation (Signed)
Anesthesia Post Note  Patient: Linda Lam  Procedure(s) Performed: RIGHT FOOT BUNION CORRECTION (Right: Foot) CHEVRON AND AIKEN OSTEOTOMY (Right: Foot) RIGHT SECOND HAMMERTOE CORRECTION WITH WEIL OSTEOTOMY (Right: Foot)     Patient location during evaluation: PACU Anesthesia Type: General Level of consciousness: awake and alert Pain management: pain level controlled Vital Signs Assessment: post-procedure vital signs reviewed and stable Respiratory status: spontaneous breathing, nonlabored ventilation and respiratory function stable Cardiovascular status: blood pressure returned to baseline Postop Assessment: no apparent nausea or vomiting Anesthetic complications: no   No notable events documented.  Last Vitals:  Vitals:   10/10/22 1230 10/10/22 1300  BP: (!) 141/78   Pulse: 72   Resp: 16   Temp:    SpO2: 100% 100%    Last Pain:  Vitals:   10/10/22 1302  TempSrc:   PainSc: 0-No pain                 Marthenia Rolling

## 2022-10-10 NOTE — Anesthesia Procedure Notes (Signed)
Procedure Name: LMA Insertion Date/Time: 10/10/2022 10:37 AM  Performed by: Savahna Casados, Ernesta Amble, CRNAPre-anesthesia Checklist: Patient identified, Emergency Drugs available, Suction available and Patient being monitored Patient Re-evaluated:Patient Re-evaluated prior to induction Oxygen Delivery Method: Circle system utilized Preoxygenation: Pre-oxygenation with 100% oxygen Induction Type: IV induction Ventilation: Mask ventilation without difficulty LMA: LMA inserted LMA Size: 4.0 Number of attempts: 1 Airway Equipment and Method: Bite block Placement Confirmation: positive ETCO2 Tube secured with: Tape Dental Injury: Teeth and Oropharynx as per pre-operative assessment

## 2022-10-10 NOTE — Transfer of Care (Signed)
Immediate Anesthesia Transfer of Care Note  Patient: Linda Lam  Procedure(s) Performed: RIGHT FOOT BUNION CORRECTION (Right: Foot) CHEVRON AND Barbie Banner OSTEOTOMY (Right: Foot) RIGHT SECOND HAMMERTOE CORRECTION WITH WEIL OSTEOTOMY (Right: Foot)  Patient Location: PACU  Anesthesia Type:GA combined with regional for post-op pain  Level of Consciousness: drowsy and patient cooperative  Airway & Oxygen Therapy: Patient Spontanous Breathing and Patient connected to face mask oxygen  Post-op Assessment: Report given to RN and Post -op Vital signs reviewed and stable  Post vital signs: Reviewed and stable  Last Vitals:  Vitals Value Taken Time  BP    Temp    Pulse 73 10/10/22 1216  Resp    SpO2 100 % 10/10/22 1216  Vitals shown include unvalidated device data.  Last Pain:  Vitals:   10/10/22 0936  TempSrc: Oral  PainSc: 0-No pain      Patients Stated Pain Goal: 4 (01/41/03 0131)  Complications: No notable events documented.

## 2022-10-10 NOTE — H&P (Signed)
Linda Lam is an 66 y.o. female.   Chief Complaint: Right foot pain HPI: 66 year old female without significant past medical history has worsening right forefoot pain due to a recurrent bunion deformity and second hammertoe.  She has signs and symptoms of metatarsalgia as well.  She has failed nonoperative treatment including activity modification, oral anti-inflammatories and shoewear modification.  She presents today for revision bunion correction and second metatarsal Weil with hammertoe correction.  Past Medical History:  Diagnosis Date   Arthritis    knees    Past Surgical History:  Procedure Laterality Date   ABDOMINAL HYSTERECTOMY     BUNIONECTOMY Left    KNEE ARTHROSCOPY Left     Family History  Problem Relation Age of Onset   Hypertension Mother    Diabetes Mother    Stroke Father    Diabetes Brother    Diabetes Maternal Grandmother    Social History:  reports that she quit smoking about 10 months ago. Her smoking use included cigarettes. She has never used smokeless tobacco. She reports current alcohol use of about 5.0 standard drinks of alcohol per week. She reports that she does not use drugs.  Allergies:  Allergies  Allergen Reactions   Latex Other (See Comments)    Burning reaction   Demerol [Meperidine] Nausea And Vomiting    This was given via IV push.    No medications prior to admission.    No results found for this or any previous visit (from the past 48 hour(s)). No results found.  Review of Systems no recent fever, chills, nausea, vomiting or changes in her appetite  Blood pressure (!) 179/75, pulse 61, temperature 98.1 F (36.7 C), temperature source Oral, resp. rate 12, height 5' (1.524 m), weight 61.1 kg, last menstrual period 02/25/1998, SpO2 100 %. Physical Exam  Well-nourished well-developed woman in no apparent distress.  Alert and oriented.  Normal mood and affect.  Extraocular motions are intact.  Respirations are unlabored.  Gait  is normal.  The right foot has a moderate bunion deformity.  Previous surgical incisions are all well-healed.  No signs of infection.  Second hammertoe deformity is not passively correctable.  There are some callus at the plantar forefoot beneath the second metatarsal head.   Assessment/Plan Painful right foot bunion deformity with metatarsalgia and a second hammertoe -to the operating room today for surgical correction of these painful and limiting right forefoot conditions.  The risks and benefits of the alternative treatment options have been discussed in detail.  The patient wishes to proceed with surgery and specifically understands risks of bleeding, infection, nerve damage, blood clots, need for additional surgery, amputation and death.   Wylene Simmer, MD 2022/11/09, 10:09 AM

## 2022-10-11 ENCOUNTER — Encounter (HOSPITAL_BASED_OUTPATIENT_CLINIC_OR_DEPARTMENT_OTHER): Payer: Self-pay | Admitting: Orthopedic Surgery

## 2023-01-31 DIAGNOSIS — R69 Illness, unspecified: Secondary | ICD-10-CM | POA: Diagnosis not present

## 2023-02-03 DIAGNOSIS — M21611 Bunion of right foot: Secondary | ICD-10-CM | POA: Diagnosis not present

## 2023-02-03 DIAGNOSIS — Z4889 Encounter for other specified surgical aftercare: Secondary | ICD-10-CM | POA: Diagnosis not present

## 2023-03-03 DIAGNOSIS — R69 Illness, unspecified: Secondary | ICD-10-CM | POA: Diagnosis not present

## 2023-03-21 ENCOUNTER — Other Ambulatory Visit: Payer: Self-pay | Admitting: Student

## 2023-03-21 DIAGNOSIS — M25562 Pain in left knee: Secondary | ICD-10-CM

## 2023-03-25 DIAGNOSIS — Z833 Family history of diabetes mellitus: Secondary | ICD-10-CM | POA: Diagnosis not present

## 2023-03-25 DIAGNOSIS — Z87891 Personal history of nicotine dependence: Secondary | ICD-10-CM | POA: Diagnosis not present

## 2023-03-25 DIAGNOSIS — Z885 Allergy status to narcotic agent status: Secondary | ICD-10-CM | POA: Diagnosis not present

## 2023-03-25 DIAGNOSIS — Z8249 Family history of ischemic heart disease and other diseases of the circulatory system: Secondary | ICD-10-CM | POA: Diagnosis not present

## 2023-03-25 DIAGNOSIS — Z823 Family history of stroke: Secondary | ICD-10-CM | POA: Diagnosis not present

## 2023-03-25 DIAGNOSIS — I10 Essential (primary) hypertension: Secondary | ICD-10-CM | POA: Diagnosis not present

## 2023-03-29 ENCOUNTER — Ambulatory Visit (INDEPENDENT_AMBULATORY_CARE_PROVIDER_SITE_OTHER): Payer: Medicare HMO

## 2023-03-29 DIAGNOSIS — M25562 Pain in left knee: Secondary | ICD-10-CM | POA: Diagnosis not present

## 2023-04-02 DIAGNOSIS — R69 Illness, unspecified: Secondary | ICD-10-CM | POA: Diagnosis not present

## 2023-05-03 DIAGNOSIS — R69 Illness, unspecified: Secondary | ICD-10-CM | POA: Diagnosis not present

## 2023-05-22 DIAGNOSIS — S83241A Other tear of medial meniscus, current injury, right knee, initial encounter: Secondary | ICD-10-CM | POA: Diagnosis not present

## 2023-05-22 DIAGNOSIS — M1712 Unilateral primary osteoarthritis, left knee: Secondary | ICD-10-CM | POA: Diagnosis not present

## 2023-05-24 DIAGNOSIS — M1712 Unilateral primary osteoarthritis, left knee: Secondary | ICD-10-CM | POA: Insufficient documentation

## 2023-06-02 DIAGNOSIS — R69 Illness, unspecified: Secondary | ICD-10-CM | POA: Diagnosis not present

## 2023-07-03 DIAGNOSIS — R69 Illness, unspecified: Secondary | ICD-10-CM | POA: Diagnosis not present

## 2023-07-28 ENCOUNTER — Telehealth: Payer: Self-pay | Admitting: Sports Medicine

## 2023-07-28 DIAGNOSIS — E782 Mixed hyperlipidemia: Secondary | ICD-10-CM

## 2023-07-28 DIAGNOSIS — R739 Hyperglycemia, unspecified: Secondary | ICD-10-CM

## 2023-07-28 NOTE — Telephone Encounter (Signed)
Patient called to schedule a Welcome to Medicare visit on 08/07/23. Patient would like to have blood work done before this appointment. Please advise.

## 2023-07-29 NOTE — Telephone Encounter (Signed)
Labs ordered.

## 2023-08-03 DIAGNOSIS — R69 Illness, unspecified: Secondary | ICD-10-CM | POA: Diagnosis not present

## 2023-08-07 ENCOUNTER — Ambulatory Visit (INDEPENDENT_AMBULATORY_CARE_PROVIDER_SITE_OTHER): Payer: Medicare HMO | Admitting: Sports Medicine

## 2023-08-07 ENCOUNTER — Encounter: Payer: Self-pay | Admitting: Sports Medicine

## 2023-08-07 VITALS — BP 133/88 | HR 77 | Ht 60.0 in | Wt 134.0 lb

## 2023-08-07 DIAGNOSIS — E782 Mixed hyperlipidemia: Secondary | ICD-10-CM | POA: Diagnosis not present

## 2023-08-07 DIAGNOSIS — R030 Elevated blood-pressure reading, without diagnosis of hypertension: Secondary | ICD-10-CM | POA: Diagnosis not present

## 2023-08-07 DIAGNOSIS — Z1231 Encounter for screening mammogram for malignant neoplasm of breast: Secondary | ICD-10-CM | POA: Diagnosis not present

## 2023-08-07 DIAGNOSIS — Z0000 Encounter for general adult medical examination without abnormal findings: Secondary | ICD-10-CM | POA: Diagnosis not present

## 2023-08-07 NOTE — Assessment & Plan Note (Addendum)
Patient got Medicare just a few months ago, welcome to Medicare physical today. Declines all vaccinations. Due for mammogram. DEXA will be due next year. She is status post hysterectomy. It sounds like she did have a colonoscopy 5 years ago with benign polyps, she was supposed to have a 5-year recall but has not had this done yet, she will follow this up herself.

## 2023-08-07 NOTE — Assessment & Plan Note (Signed)
We will need to treat this if still elevated.

## 2023-08-07 NOTE — Assessment & Plan Note (Signed)
Blood pressures are frequently elevated but improved on recheck, typically runs 130s over 80s at home. No intervention needed.

## 2023-08-07 NOTE — Progress Notes (Signed)
Subjective:    Linda Lam is a 67 y.o. female who presents for a Welcome to Medicare exam.   Review of Systems A comprehensive review of systems is negative except as noted below        Objective:    Today's Vitals   08/07/23 0938 08/07/23 1003  BP: (!) 158/89 133/88  Pulse: 77   SpO2: 100%   Weight: 134 lb (60.8 kg)   Height: 5' (1.524 m)   Body mass index is 26.17 kg/m.  Medications Outpatient Encounter Medications as of 08/07/2023  Medication Sig   [DISCONTINUED] docusate sodium (COLACE) 100 MG capsule Take 1 capsule (100 mg total) by mouth 2 (two) times daily. While taking narcotic pain medicine. (Patient not taking: Reported on 08/07/2023)   [DISCONTINUED] senna (SENOKOT) 8.6 MG TABS tablet Take 2 tablets (17.2 mg total) by mouth 2 (two) times daily. (Patient not taking: Reported on 08/07/2023)   No facility-administered encounter medications on file as of 08/07/2023.     History: Past Medical History:  Diagnosis Date   Arthritis    knees   Past Surgical History:  Procedure Laterality Date   ABDOMINAL HYSTERECTOMY     AIKEN OSTEOTOMY Right 10/10/2022   Procedure: CHEVRON AND Quintella Reichert OSTEOTOMY;  Surgeon: Toni Arthurs, MD;  Location: [facility];  Service: Orthopedics;  Laterality: Right;   BUNIONECTOMY Left    BUNIONECTOMY Right 10/10/2022   Procedure: RIGHT FOOT BUNION CORRECTION;  Surgeon: Toni Arthurs, MD;  Location: [facility];  Service: Orthopedics;  Laterality: Right;   HAMMERTOE RECONSTRUCTION WITH WEIL OSTEOTOMY Right 10/10/2022   Procedure: RIGHT SECOND HAMMERTOE CORRECTION WITH WEIL OSTEOTOMY;  Surgeon: Toni Arthurs, MD;  Location: [facility];  Service: Orthopedics;  Laterality: Right;   KNEE ARTHROSCOPY Left     Family History  Problem Relation Age of Onset   Hypertension Mother    Diabetes Mother    Stroke Father    Diabetes Brother    Diabetes Maternal Grandmother    Social History   Occupational  History   Not on file  Tobacco Use   Smoking status: Former    Current packs/day: 0.00    Types: Cigarettes    Quit date: 2023    Years since quitting: 1.6   Smokeless tobacco: Never  Substance and Sexual Activity   Alcohol use: Yes    Alcohol/week: 5.0 standard drinks of alcohol    Types: 5 Standard drinks or equivalent per week    Comment: social   Drug use: No   Sexual activity: Yes    Partners: Male    Birth control/protection: Surgical    Tobacco Counseling Counseling given: Not Answered   Immunizations and Health Maintenance Immunization History  Administered Date(s) Administered   PFIZER(Purple Top)SARS-COV-2 Vaccination 09/15/2020, 10/11/2020   Tdap 01/22/2017   Health Maintenance Due  Topic Date Due   Fecal DNA (Cologuard)  02/17/2020    Activities of Daily Living    08/06/2023    8:33 PM 10/10/2022    9:39 AM  In your present state of health, do you have any difficulty performing the following activities:  Hearing? 0 0  Vision? 0 0  Difficulty concentrating or making decisions? 0 0  Walking or climbing stairs? 0 0  Dressing or bathing? 0 0  Doing errands, shopping? 0   Preparing Food and eating ? N   Using the Toilet? N   In the past six months, have you accidently leaked urine?  N   Do you have problems with loss of bowel control? N   Managing your Medications? N   Managing your Finances? N   Housekeeping or managing your Housekeeping? N     Physical Exam    General: Well Developed, well nourished, and in no acute distress.  Neuro: Alert and oriented x3, extra-ocular muscles intact, sensation grossly intact. Cranial nerves II through XII are intact, motor, sensory, and coordinative functions are all intact. HEENT: Normocephalic, atraumatic, pupils equal round reactive to light, neck supple, no masses, no lymphadenopathy, thyroid nonpalpable. Oropharynx, nasopharynx, external ear canals are unremarkable. Skin: Warm and dry, no rashes noted.   Cardiac: Regular rate and rhythm, no murmurs rubs or gallops.  Respiratory: Clear to auscultation bilaterally. Not using accessory muscles, speaking in full sentences.  Abdominal: Soft, nontender, nondistended, positive bowel sounds, no masses, no organomegaly.  Musculoskeletal: Shoulder, elbow, wrist, hip, knee, ankle stable, and with full range of motion.  Advanced Directives: Full code      Assessment:     Goals   None    Depression Screen    08/07/2023    9:42 AM 04/18/2022    9:20 AM 09/07/2020    9:21 AM 12/19/2017    8:50 AM  PHQ 2/9 Scores  PHQ - 2 Score 0 0 0 0     Fall Risk    08/06/2023    8:33 PM  Fall Risk   Falls in the past year? 0  Number falls in past yr: 0  Injury with Fall? 0    Cognitive Function:        Patient Care Team: Monica Becton, MD as PCP - General (Sports Medicine)     Plan:   Annual physical exam Patient got Medicare just a few months ago, welcome to Medicare physical today. Declines all vaccinations. Due for mammogram. DEXA will be due next year. She is status post hysterectomy. It sounds like she did have a colonoscopy 5 years ago with benign polyps, she was supposed to have a 5-year recall but has not had this done yet, she will follow this up herself.  Mixed hyperlipidemia We will need to treat this if still elevated.  White coat syndrome without diagnosis of hypertension Blood pressures are frequently elevated but improved on recheck, typically runs 130s over 80s at home. No intervention needed.   I have personally reviewed and noted the following in the patient's chart:   Medical and social history Use of alcohol, tobacco or illicit drugs  Current medications and supplements Functional ability and status Nutritional status Physical activity Advanced directives List of other physicians Hospitalizations, surgeries, and ER visits in previous 12 months Vitals Screenings to include cognitive, depression, and  falls Referrals and appointments  In addition, I have reviewed and discussed with patient certain preventive protocols, quality metrics, and best practice recommendations. A written personalized care plan for preventive services as well as general preventive health recommendations were provided to patient.   ___________________________________________ Ihor Austin. Benjamin Stain, M.D., ABFM., CAQSM., AME. Primary Care and Sports Medicine [facility]  Adjunct Instructor of Family Medicine  [employer]  Restaurant manager, fast food

## 2023-08-08 DIAGNOSIS — E782 Mixed hyperlipidemia: Secondary | ICD-10-CM | POA: Diagnosis not present

## 2023-08-08 DIAGNOSIS — R739 Hyperglycemia, unspecified: Secondary | ICD-10-CM | POA: Diagnosis not present

## 2023-08-09 LAB — LIPID PANEL
Chol/HDL Ratio: 3.1 ratio (ref 0.0–4.4)
Cholesterol, Total: 215 mg/dL — ABNORMAL HIGH (ref 100–199)
HDL: 69 mg/dL (ref >39)
LDL Chol Calc (NIH): 131 mg/dL — ABNORMAL HIGH (ref 0–99)
Triglycerides: 85 mg/dL (ref 0–149)
VLDL Cholesterol Cal: 15 mg/dL (ref 5–40)

## 2023-08-09 LAB — COMPREHENSIVE METABOLIC PANEL
ALT: 13 IU/L (ref 0–32)
AST: 15 IU/L (ref 0–40)
Albumin: 4.5 g/dL (ref 3.9–4.9)
Alkaline Phosphatase: 108 IU/L (ref 44–121)
BUN/Creatinine Ratio: 16 (ref 12–28)
BUN: 17 mg/dL (ref 8–27)
Bilirubin Total: 0.5 mg/dL (ref 0.0–1.2)
CO2: 24 mmol/L (ref 20–29)
Calcium: 9.7 mg/dL (ref 8.7–10.3)
Chloride: 102 mmol/L (ref 96–106)
Creatinine, Ser: 1.07 mg/dL — ABNORMAL HIGH (ref 0.57–1.00)
Globulin, Total: 1.8 g/dL (ref 1.5–4.5)
Glucose: 83 mg/dL (ref 70–99)
Potassium: 4.6 mmol/L (ref 3.5–5.2)
Sodium: 141 mmol/L (ref 134–144)
Total Protein: 6.3 g/dL (ref 6.0–8.5)
eGFR: 57 mL/min/{1.73_m2} — ABNORMAL LOW (ref >59)

## 2023-08-09 LAB — CBC
Hematocrit: 43.5 % (ref 34.0–46.6)
Hemoglobin: 14.3 g/dL (ref 11.1–15.9)
MCH: 28.8 pg (ref 26.6–33.0)
MCHC: 32.9 g/dL (ref 31.5–35.7)
MCV: 88 fL (ref 79–97)
Platelets: 250 10*3/uL (ref 150–450)
RBC: 4.97 x10E6/uL (ref 3.77–5.28)
RDW: 12.7 % (ref 11.7–15.4)
WBC: 6.5 10*3/uL (ref 3.4–10.8)

## 2023-08-09 LAB — HEMOGLOBIN A1C
Est. average glucose Bld gHb Est-mCnc: 117 mg/dL
Hgb A1c MFr Bld: 5.7 % — ABNORMAL HIGH (ref 4.8–5.6)

## 2023-08-09 LAB — TSH: TSH: 1.52 u[IU]/mL (ref 0.450–4.500)

## 2023-08-10 ENCOUNTER — Encounter: Payer: Self-pay | Admitting: Sports Medicine

## 2023-08-11 DIAGNOSIS — T8484XA Pain due to internal orthopedic prosthetic devices, implants and grafts, initial encounter: Secondary | ICD-10-CM | POA: Diagnosis not present

## 2023-08-11 DIAGNOSIS — M79671 Pain in right foot: Secondary | ICD-10-CM | POA: Diagnosis not present

## 2023-08-11 DIAGNOSIS — M7741 Metatarsalgia, right foot: Secondary | ICD-10-CM | POA: Diagnosis not present

## 2023-08-20 DIAGNOSIS — R69 Illness, unspecified: Secondary | ICD-10-CM | POA: Diagnosis not present

## 2023-09-03 DIAGNOSIS — M25562 Pain in left knee: Secondary | ICD-10-CM | POA: Diagnosis not present

## 2023-09-03 DIAGNOSIS — M25561 Pain in right knee: Secondary | ICD-10-CM | POA: Diagnosis not present

## 2023-09-24 ENCOUNTER — Ambulatory Visit: Payer: Medicare HMO

## 2023-09-24 DIAGNOSIS — Z1231 Encounter for screening mammogram for malignant neoplasm of breast: Secondary | ICD-10-CM | POA: Diagnosis not present

## 2023-10-28 DIAGNOSIS — R69 Illness, unspecified: Secondary | ICD-10-CM | POA: Diagnosis not present

## 2024-01-21 ENCOUNTER — Encounter (INDEPENDENT_AMBULATORY_CARE_PROVIDER_SITE_OTHER): Payer: Self-pay | Admitting: Sports Medicine

## 2024-01-21 DIAGNOSIS — E782 Mixed hyperlipidemia: Secondary | ICD-10-CM | POA: Diagnosis not present

## 2024-01-22 NOTE — Telephone Encounter (Signed)

## 2024-03-22 ENCOUNTER — Telehealth: Payer: Self-pay | Admitting: Sports Medicine

## 2024-03-22 DIAGNOSIS — H00012 Hordeolum externum right lower eyelid: Secondary | ICD-10-CM

## 2024-03-22 NOTE — Telephone Encounter (Signed)
 Pt called office requesting Rx for Erythromycin  ophthalmic ointment to be sent to   [facility] [address] [place], [place] - [address], [place] [address] Phone: [phone]  Fax: [phone]   Pt also would like a referral for colonoscopy to be sent to [facility]. Please advise.

## 2024-03-25 MED ORDER — ERYTHROMYCIN 5 MG/GM OP OINT: 1.0000 | TOPICAL_OINTMENT | Freq: Three times a day (TID) | OPHTHALMIC | 0 refills | Status: AC

## 2024-03-25 NOTE — Telephone Encounter (Signed)
 I refilled the erythromycin  ophthalmic ointment

## 2024-04-30 ENCOUNTER — Ambulatory Visit
Admission: EM | Admit: 2024-04-30 | Discharge: 2024-04-30 | Disposition: A | Discharge status: Home / Self Care | Attending: Family Medicine | Admitting: Family Medicine

## 2024-04-30 DIAGNOSIS — J0100 Acute maxillary sinusitis, unspecified: Secondary | ICD-10-CM | POA: Diagnosis not present

## 2024-04-30 DIAGNOSIS — R059 Cough, unspecified: Secondary | ICD-10-CM | POA: Diagnosis not present

## 2024-04-30 MED ORDER — AMOXICILLIN-POT CLAVULANATE 875-125 MG PO TABS: 1.0000 | ORAL_TABLET | Freq: Two times a day (BID) | ORAL | 0 refills | Status: DC

## 2024-04-30 MED ORDER — HYDROCODONE BIT-HOMATROP MBR 5-1.5 MG/5ML PO SOLN: 5.0000 mL | Freq: Four times a day (QID) | ORAL | 0 refills | Status: DC | PRN

## 2024-04-30 MED ORDER — PREDNISONE 20 MG PO TABS: ORAL_TABLET | ORAL | 0 refills | Status: DC

## 2024-04-30 NOTE — ED Triage Notes (Signed)
 Pt c/o nasal congestion, cough and RT ear pain since Sunday. Denies fever. Sudafed and afrin prn.

## 2024-04-30 NOTE — Discharge Instructions (Addendum)
 Advised patient to take medications as directed with food to completion.  Advised patient to take prednisone with first dose of Augmentin for the next 5 of 7 days.  Advised may use Hycodan cough syrup at night prior to sleep for cough due to sedative effects.  Encouraged to increase daily water intake to 64 ounces per day while taking these medications.  Advised if symptoms worsen and/or unresolved please follow-up with your PCP or here for further evaluation.

## 2024-04-30 NOTE — ED Provider Notes (Signed)
 [name]    CSN: [id number] Arrival date & time: 04/30/24  1627      History   Chief Complaint Chief Complaint  Patient presents with   Nasal Congestion    HPI Linda Lam is a 67 y.o. female.   HPI 68 year old female presents with sinus nasal congestion and cough for 6 to 7 days PMH significant for SCC/BCC, mixed HLD, and lumbar spinal stenosis.  Past Medical History:  Diagnosis Date   Arthritis    knees    Patient Active Problem List   Diagnosis Date Noted   White coat syndrome without diagnosis of hypertension 08/07/2023   Mixed hyperlipidemia 05/11/2022   Mass of lower leg, right 09/07/2020   Radiculitis of left cervical region 09/11/2018   S/P left bunionectomy 01/20/2018   Positive colorectal cancer screening using DNA-based stool test 02/24/2017   Lumbar spinal stenosis 04/18/2016   Annual physical exam 06/27/2015   Primary osteoarthritis of both knees 06/27/2015   Primary osteoarthritis of left hip 06/27/2015   SCC (squamous cell carcinoma) 08/18/2013   Trace mitral regurgitation by prior echocardiogram 02/25/2013   S/P hysterectomy 02/25/2013   Fibrocystic breast disease 02/25/2013   Eczema 02/25/2013   Skin cancer, basal cell 02/25/2013   Sinus bradycardia by electrocardiogram 02/25/2013    Past Surgical History:  Procedure Laterality Date   ABDOMINAL HYSTERECTOMY     Candida Chalk OSTEOTOMY Right 10/10/2022   Procedure: CHEVRON AND Candida Chalk OSTEOTOMY;  Surgeon: Amada Backer, MD;  Location: [facility];  Service: Orthopedics;  Laterality: Right;   BUNIONECTOMY Left    BUNIONECTOMY Right 10/10/2022   Procedure: RIGHT FOOT BUNION CORRECTION;  Surgeon: Amada Backer, MD;  Location: [facility];  Service: Orthopedics;  Laterality: Right;   HAMMERTOE RECONSTRUCTION WITH WEIL OSTEOTOMY Right 10/10/2022   Procedure: RIGHT SECOND HAMMERTOE CORRECTION WITH WEIL OSTEOTOMY;  Surgeon: Amada Backer, MD;  Location:   SURGERY CENTER;  Service: Orthopedics;  Laterality: Right;   KNEE ARTHROSCOPY Left     OB History     Gravida  2   Para      Term      Preterm      AB      Living  2      SAB      IAB      Ectopic      Multiple      Live Births               Home Medications    Prior to Admission medications   Medication Sig Start Date End Date Taking? Authorizing Provider  amoxicillin -clavulanate (AUGMENTIN ) 875-125 MG tablet Take 1 tablet by mouth every 12 (twelve) hours. 04/30/24  Yes Deveron Shamoon, FNP  HYDROcodone bit-homatropine (HYCODAN) 5-1.5 MG/5ML syrup Take 5 mLs by mouth every 6 (six) hours as needed for cough. 04/30/24  Yes Linda Ramp, FNP  predniSONE  (DELTASONE ) 20 MG tablet Take 3 tabs PO daily x 5 days. 04/30/24  Yes Linda Ramp, FNP    Family History Family History  Problem Relation Age of Onset   Hypertension Mother    Diabetes Mother    Stroke Father    Diabetes Brother    Diabetes Maternal Grandmother     Social History Social History   Tobacco Use   Smoking status: Former    Current packs/day: 0.00    Types: Cigarettes    Quit date: 2023    Years since quitting: 2.4   Smokeless tobacco:  Never  Substance Use Topics   Alcohol use: Yes    Alcohol/week: 5.0 standard drinks of alcohol    Types: 5 Standard drinks or equivalent per week    Comment: social   Drug use: No     Allergies   Latex and Demerol [meperidine]   Review of Systems Review of Systems   Physical Exam Triage Vital Signs ED Triage Vitals  Encounter Vitals Group     BP      Systolic BP Percentile      Diastolic BP Percentile      Pulse      Resp      Temp      Temp src      SpO2      Weight      Height      Head Circumference      Peak Flow      Pain Score      Pain Loc      Pain Education      Exclude from Growth Chart    No data found.  Updated Vital Signs BP (!) 171/91 (BP Location: Right Arm)   Pulse 81   Temp 99.3 F (37.4 C) (Oral)    Resp 17   LMP 02/25/1998   SpO2 100%    Physical Exam Vitals and nursing note reviewed.  Constitutional:      Appearance: Normal appearance. She is obese. She is ill-appearing.  HENT:     Head: Normocephalic and atraumatic.     Right Ear: Tympanic membrane and external ear normal.     Left Ear: Tympanic membrane and external ear normal.     Ears:     Comments: Significant eustachian tube dysfunction noted bilaterally    Nose: Nose normal.     Mouth/Throat:     Mouth: Mucous membranes are moist.     Pharynx: Oropharynx is clear.  Eyes:     Extraocular Movements: Extraocular movements intact.     Conjunctiva/sclera: Conjunctivae normal.     Pupils: Pupils are equal, round, and reactive to light.  Cardiovascular:     Rate and Rhythm: Normal rate and regular rhythm.     Pulses: Normal pulses.     Heart sounds: Normal heart sounds. No murmur heard. Pulmonary:     Effort: Pulmonary effort is normal.     Breath sounds: Normal breath sounds. No wheezing, rhonchi or rales.     Comments: Infrequent nonproductive cough on exam Chest:     Chest wall: No tenderness.  Musculoskeletal:        General: Normal range of motion.  Skin:    General: Skin is warm and dry.  Neurological:     General: No focal deficit present.     Mental Status: She is alert and oriented to person, place, and time. Mental status is at baseline.  Psychiatric:        Mood and Affect: Mood normal.        Behavior: Behavior normal.      UC Treatments / Results  Labs (all labs ordered are listed, but only abnormal results are displayed) Labs Reviewed - No data to display  EKG   Radiology No results found.  Procedures Procedures (including critical care time)  Medications Ordered in UC Medications - No data to display  Initial Impression / Assessment and Plan / UC Course  I have reviewed the triage vital signs and the nursing notes.  Pertinent labs & imaging results that were  available during my  care of the patient were reviewed by me and considered in my medical decision making (see chart for details).     MDM: 1.  Acute maxillary sinusitis, recurrence not specified-Rx'd Augmentin  875/125 mg tablet: Take 1 tablet twice daily x 7 days; 2.  Cough, unspecified type-Rx'd prednisone  20 mg tablet: Take 3 tablets p.o daily x 5 days, Rx'd Hycodan 5-1.5 mg / 5 mL cough syrup: Take 5 mL every 6 hours, as needed for cough. Advised patient to take medications as directed with food to completion.  Advised patient to take prednisone  with first dose of Augmentin  for the next 5 of 7 days.  Advised may use Hycodan cough syrup at night prior to sleep for cough due to sedative effects.  Encouraged to increase daily water intake to 64 ounces per day while taking these medications.  Advised if symptoms worsen and/or unresolved please follow-up with your PCP or here for further evaluation.  Patient discharged home, hemodynamically stable. Final Clinical Impressions(s) / UC Diagnoses   Final diagnoses:  Acute maxillary sinusitis, recurrence not specified  Cough, unspecified type     Discharge Instructions      Advised patient to take medications as directed with food to completion.  Advised patient to take prednisone  with first dose of Augmentin  for the next 5 of 7 days.  Advised may use Hycodan cough syrup at night prior to sleep for cough due to sedative effects.  Encouraged to increase daily water intake to 64 ounces per day while taking these medications.  Advised if symptoms worsen and/or unresolved please follow-up with your PCP or here for further evaluation.   ED Prescriptions     Medication Sig Dispense Auth. Provider   amoxicillin -clavulanate (AUGMENTIN ) 875-125 MG tablet Take 1 tablet by mouth every 12 (twelve) hours. 14 tablet Linda Jaquith, FNP   predniSONE  (DELTASONE ) 20 MG tablet Take 3 tabs PO daily x 5 days. 15 tablet Linda Wandersee, FNP   HYDROcodone bit-homatropine (HYCODAN) 5-1.5  MG/5ML syrup Take 5 mLs by mouth every 6 (six) hours as needed for cough. 120 mL Linda Ramp, FNP      I have reviewed the PDMP during this encounter.   Linda Ramp, FNP 04/30/24 1712

## 2024-05-11 DIAGNOSIS — H6993 Unspecified Eustachian tube disorder, bilateral: Secondary | ICD-10-CM | POA: Diagnosis not present

## 2024-05-11 DIAGNOSIS — H6501 Acute serous otitis media, right ear: Secondary | ICD-10-CM | POA: Diagnosis not present

## 2024-05-11 DIAGNOSIS — H6121 Impacted cerumen, right ear: Secondary | ICD-10-CM | POA: Diagnosis not present

## 2024-05-17 DIAGNOSIS — H52203 Unspecified astigmatism, bilateral: Secondary | ICD-10-CM | POA: Diagnosis not present

## 2024-05-27 DIAGNOSIS — H90A22 Sensorineural hearing loss, unilateral, left ear, with restricted hearing on the contralateral side: Secondary | ICD-10-CM | POA: Diagnosis not present

## 2024-05-27 DIAGNOSIS — H6501 Acute serous otitis media, right ear: Secondary | ICD-10-CM | POA: Diagnosis not present

## 2024-05-27 DIAGNOSIS — H6991 Unspecified Eustachian tube disorder, right ear: Secondary | ICD-10-CM | POA: Diagnosis not present

## 2024-05-27 DIAGNOSIS — H90A31 Mixed conductive and sensorineural hearing loss, unilateral, right ear with restricted hearing on the contralateral side: Secondary | ICD-10-CM | POA: Diagnosis not present

## 2024-08-03 ENCOUNTER — Encounter: Payer: Self-pay | Admitting: Sports Medicine

## 2024-08-09 ENCOUNTER — Encounter: Payer: Self-pay | Admitting: Urgent Care

## 2024-08-09 ENCOUNTER — Ambulatory Visit (INDEPENDENT_AMBULATORY_CARE_PROVIDER_SITE_OTHER): Admitting: Urgent Care

## 2024-08-09 VITALS — BP 142/96 | HR 54 | Ht 60.0 in | Wt 133.0 lb

## 2024-08-09 DIAGNOSIS — D126 Benign neoplasm of colon, unspecified: Secondary | ICD-10-CM | POA: Insufficient documentation

## 2024-08-09 DIAGNOSIS — Z0000 Encounter for general adult medical examination without abnormal findings: Secondary | ICD-10-CM

## 2024-08-09 DIAGNOSIS — Z13820 Encounter for screening for osteoporosis: Secondary | ICD-10-CM

## 2024-08-09 DIAGNOSIS — N289 Disorder of kidney and ureter, unspecified: Secondary | ICD-10-CM | POA: Diagnosis not present

## 2024-08-09 DIAGNOSIS — I340 Nonrheumatic mitral (valve) insufficiency: Secondary | ICD-10-CM | POA: Insufficient documentation

## 2024-08-09 DIAGNOSIS — E782 Mixed hyperlipidemia: Secondary | ICD-10-CM

## 2024-08-09 DIAGNOSIS — E559 Vitamin D deficiency, unspecified: Secondary | ICD-10-CM | POA: Diagnosis not present

## 2024-08-09 DIAGNOSIS — R7303 Prediabetes: Secondary | ICD-10-CM | POA: Diagnosis not present

## 2024-08-09 DIAGNOSIS — N393 Stress incontinence (female) (male): Secondary | ICD-10-CM

## 2024-08-09 NOTE — Patient Instructions (Signed)
 We completed your annual physical today. Please return for a nurse visit for a blood pressure recheck and BRING YOUR HOME BP MONITOR for comparison.  Labs drawn today.  Please consider updating your pneumonia and shingles vaccines.  I have ordered a bone density. Please follow up with GI for your colonoscopy.  Return for BP recheck, otherwise annually.

## 2024-08-09 NOTE — Progress Notes (Signed)
 Complete physical exam  Patient: Linda Lam   DOB: 1956-05-12   68 y.o. Female  MRN: [id number]  Subjective:    Chief Complaint  Patient presents with   Annual Exam    Fasting; home BP 130-140    Linda Lam is a 68 y.o. female who presents today for a complete physical exam. She reports consuming a general diet. Gym/ health club routine includes a bit of everything - pt does personal training and works at [employer] She generally feels well. She reports sleeping fairly well. She does not have additional problems to discuss today.   Discussed the use of AI scribe software for clinical note transcription with the patient, who gave verbal consent to proceed.  History of Present Illness   Linda Lam is a 68 year old female who presents with concerns about elevated blood pressure readings at home.  She has been monitoring her blood pressure at home, noting elevated readings with a recent measurement of 139/82 mmHg. Her morning readings are typically in the 140s/78 mmHg range, escalating to 154 mmHg later in the day. She is concerned about the accuracy of her home blood pressure cuff and prefers to avoid starting medication if possible. She has a family history of hypertension, with many relatives on medication.  She is not currently taking any prescription medications apart from Retin A 0.1% cream on her face. She has vitamin D  deficiency but does not take supplements regularly. She was on prednisone  for two weeks for a severe cold and sinus infection, which led to temporary hearing loss in her right ear.  Her diet consists of three meals a day, but she struggles with weight gain, particularly in the midsection, which she attributes to hormonal changes. She is very active, attending the gym seven days a week, participating in various classes such as interval training, Zumba, body toning, and strength training. Despite her active lifestyle, she finds it difficult to  lose weight.  She has a history of a torn meniscus in her knee, which required surgery, and herniated discs in her back. She also has a history of a tubular adenoma found during a colonoscopy in 2018 and is due for another colonoscopy. 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  She experiences stress incontinence, particularly when sneezing or delaying urination while at work. She gets up two to three times a night to urinate, which she attributes to her high water intake. She has had a hysterectomy and is considering seeing a urogynecologist for her symptoms.  She mentions a history of mild mitral valve regurgitation, noted in an echocardiogram two years ago, and a faint heart murmur. Her last bone density test was two years ago. She has a family history of her mother having both hips replaced at the age of 47.       Most recent fall risk assessment:    08/06/2023    8:33 PM  Fall Risk   Falls in the past year? 0  Number falls in past yr: 0  Injury with Fall? 0     Most recent depression screenings:    08/07/2023    9:42 AM 04/18/2022    9:20 AM  PHQ 2/9 Scores  PHQ - 2 Score 0 0    Vision:Within last year and Dental: No current dental problems and Receives regular dental care  Patient Active Problem List   Diagnosis Date Noted  Tubular adenoma of colon 08/09/2024   Abnormal renal function 08/09/2024   Prediabetes 08/09/2024   Mitral valve insufficiency 08/09/2024   White coat syndrome without diagnosis of hypertension 08/07/2023   Mixed hyperlipidemia 05/11/2022   Mass of lower leg, right 09/07/2020   Radiculitis of left cervical region 09/11/2018   S/P left bunionectomy 01/20/2018   Positive colorectal cancer screening using DNA-based stool test 02/24/2017   Lumbar spinal stenosis 04/18/2016   Annual physical exam 06/27/2015   Primary osteoarthritis of both knees 06/27/2015    Primary osteoarthritis of left hip 06/27/2015   SCC (squamous cell carcinoma) 08/18/2013   Trace mitral regurgitation by prior echocardiogram 02/25/2013   S/P hysterectomy 02/25/2013   Fibrocystic breast disease 02/25/2013   Eczema 02/25/2013   Skin cancer, basal cell 02/25/2013   Sinus bradycardia by electrocardiogram 02/25/2013   Past Medical History:  Diagnosis Date   Allergy 2019   Arthritis    knees   Heart murmur    Past Surgical History:  Procedure Laterality Date   ABDOMINAL HYSTERECTOMY     AIKEN OSTEOTOMY Right 10/10/2022   Procedure: CHEVRON AND [name];  Surgeon: [name], MD;  Location: [facility];  Service: Orthopedics;  Laterality: Right;   BUNIONECTOMY Left    BUNIONECTOMY Right 10/10/2022   Procedure: RIGHT FOOT BUNION CORRECTION;  Surgeon: [name], MD;  Location: [facility];  Service: Orthopedics;  Laterality: Right;   COSMETIC SURGERY  05-16-13   HAMMERTOE RECONSTRUCTION WITH WEIL OSTEOTOMY Right 10/10/2022   Procedure: RIGHT SECOND HAMMERTOE CORRECTION WITH WEIL OSTEOTOMY;  Surgeon: [name], MD;  Location: [facility];  Service: Orthopedics;  Laterality: Right;   KNEE ARTHROSCOPY Left    Social History   Tobacco Use   Smoking status: Passive Smoke Exposure - Never Smoker   Smokeless tobacco: Never   Tobacco comments:    Light smoker  Substance Use Topics   Alcohol use: Yes    Alcohol/week: 2.0 standard drinks of alcohol    Types: 2 Standard drinks or equivalent per week    Comment: social   Drug use: No      Patient Care Team: Lowella Benton CROME, [place] as PCP - General (Physician Assistant)   Outpatient Medications Prior to Visit  Medication Sig   tretinoin  (RETIN-A ) 0.1 % cream Apply topically at bedtime.   [DISCONTINUED] amoxicillin -clavulanate (AUGMENTIN ) 875-125 MG tablet Take 1 tablet by mouth every 12 (twelve) hours.   [DISCONTINUED] HYDROcodone  bit-homatropine (HYCODAN) 5-1.5  MG/5ML syrup Take 5 mLs by mouth every 6 (six) hours as needed for cough.   [DISCONTINUED] predniSONE  (DELTASONE ) 20 MG tablet Take 3 tabs PO daily x 5 days.   No facility-administered medications prior to visit.    ROS Complete 12 point ROS performed with all pertinent positives listed in HPI     Objective:     BP (!) 142/96   Pulse (!) 54   Ht 5' (1.524 m)   Wt 133 lb (60.3 kg)   LMP 02/25/1998   SpO2 100%   BMI 25.97 kg/m  BP Readings from Last 3 Encounters:  08/09/24 (!) 142/96  04/30/24 (!) 171/91  08/07/23 133/88   Wt Readings from Last 3 Encounters:  08/09/24 133 lb (60.3 kg)  08/07/23 134 lb (60.8 kg)  10/10/22 134 lb 11.2 oz (61.1 kg)      Physical Exam Vitals and nursing note reviewed.  Constitutional:      General: She is not in acute distress.  Appearance: Normal appearance. She is not ill-appearing, toxic-appearing or diaphoretic.  HENT:     Head: Normocephalic and atraumatic.     Right Ear: Tympanic membrane, ear canal and external ear normal. There is no impacted cerumen.     Left Ear: Tympanic membrane, ear canal and external ear normal. There is no impacted cerumen.     Nose: Nose normal.     Mouth/Throat:     Mouth: Mucous membranes are moist.     Pharynx: Oropharynx is clear. No oropharyngeal exudate or posterior oropharyngeal erythema.  Eyes:     General: No scleral icterus.       Right eye: No discharge.        Left eye: No discharge.     Extraocular Movements: Extraocular movements intact.     Pupils: Pupils are equal, round, and reactive to light.  Neck:     Thyroid : No thyroid  mass, thyromegaly or thyroid  tenderness.  Cardiovascular:     Rate and Rhythm: Normal rate and regular rhythm.     Pulses: Normal pulses.     Heart sounds: Murmur (faint) heard.  Pulmonary:     Effort: Pulmonary effort is normal. No respiratory distress.     Breath sounds: Normal breath sounds. No stridor. No wheezing or rhonchi.  Abdominal:     General:  Abdomen is flat. Bowel sounds are normal. There is no distension.     Palpations: Abdomen is soft. There is no mass.     Tenderness: There is no abdominal tenderness. There is no guarding.  Musculoskeletal:     Cervical back: Normal range of motion and neck supple. No rigidity or tenderness.     Right lower leg: No edema.     Left lower leg: No edema.  Lymphadenopathy:     Cervical: No cervical adenopathy.  Skin:    General: Skin is warm and dry.     Coloration: Skin is not jaundiced.     Findings: Lesion (SK to back) present. No bruising, erythema or rash.  Neurological:     General: No focal deficit present.     Mental Status: She is alert and oriented to person, place, and time.     Sensory: No sensory deficit.     Motor: No weakness.  Psychiatric:        Mood and Affect: Mood normal.        Behavior: Behavior normal.      No results found for any visits on 08/09/24. Last CBC Lab Results  Component Value Date   WBC 6.5 08/08/2023   HGB 14.3 08/08/2023   HCT 43.5 08/08/2023   MCV 88 08/08/2023   MCH 28.8 08/08/2023   RDW 12.7 08/08/2023   PLT 250 08/08/2023   Last metabolic panel Lab Results  Component Value Date   GLUCOSE 83 08/08/2023   NA 141 08/08/2023   K 4.6 08/08/2023   CL 102 08/08/2023   CO2 24 08/08/2023   BUN 17 08/08/2023   CREATININE 1.07 (H) 08/08/2023   EGFR 57 (L) 08/08/2023   CALCIUM 9.7 08/08/2023   PROT 6.3 08/08/2023   ALBUMIN 4.5 08/08/2023   LABGLOB 1.8 08/08/2023   BILITOT 0.5 08/08/2023   ALKPHOS 108 08/08/2023   AST 15 08/08/2023   ALT 13 08/08/2023   Last lipids Lab Results  Component Value Date   CHOL 215 (H) 08/08/2023   HDL 69 08/08/2023   LDLCALC 131 (H) 08/08/2023   TRIG 85 08/08/2023   CHOLHDL 3.1 08/08/2023  Last hemoglobin A1c Lab Results  Component Value Date   HGBA1C 5.7 (H) 08/08/2023   Last thyroid  functions Lab Results  Component Value Date   TSH 1.520 08/08/2023   Last vitamin D  Lab Results   Component Value Date   VD25OH 23 (L) 12/19/2017   Last vitamin B12 and Folate No results found for: VITAMINB12, FOLATE      Assessment & Plan:    Routine Health Maintenance and Physical Exam  Immunization History  Administered Date(s) Administered   PFIZER(Purple Top)SARS-COV-2 Vaccination 09/15/2020, 10/11/2020   Tdap 01/22/2017    Health Maintenance  Topic Date Due   Pneumococcal Vaccine: 50+ Years (1 of 2 - PCV) Never done   Zoster Vaccines- Shingrix (1 of 2) Never done   COVID-19 Vaccine (3 - Pfizer risk series) 11/08/2020   Medicare Annual Wellness (AWV)  08/06/2024   Influenza Vaccine  03/01/2025 (Originally 07/02/2024)   MAMMOGRAM  09/23/2025   DTaP/Tdap/Td (2 - Td or Tdap) 01/22/2027   Colonoscopy  05/01/2027   DEXA SCAN  Completed   Hepatitis C Screening  Completed   HPV VACCINES  Aged Out   Meningococcal B Vaccine  Aged Out   Fecal DNA (Cologuard)  Discontinued    Discussed health benefits of physical activity, and encouraged her to engage in regular exercise appropriate for her age and condition.  Problem List Items Addressed This Visit     Mixed hyperlipidemia   Relevant Orders   Lipid panel   Tubular adenoma of colon   Relevant Orders   Ambulatory referral to Gastroenterology   Abnormal renal function   Relevant Orders   Comprehensive metabolic panel with GFR   Prediabetes   Relevant Orders   Hemoglobin A1c   Mitral valve insufficiency   Other Visit Diagnoses       Routine adult health maintenance    -  Primary   Relevant Orders   CBC with Differential/Platelet   Hemoglobin A1c   TSH   Lipid panel   Comprehensive metabolic panel with GFR     Screening for osteoporosis       Relevant Orders   DG Bone Density     Vitamin D  deficiency       Relevant Orders   DG Bone Density   Vitamin D  (25 hydroxy)     Stress incontinence       Relevant Orders   Ambulatory referral to Urogynecology      Return in about 1 year (around 08/09/2025)  for Annual Physical.  Assessment and Plan    Adult Wellness Visit Annual wellness visit conducted. Vitamin D  deficiency noted. - Order bone density test. - Refer to gastroenterologist for colonoscopy due to history of tubular adenoma in 2018. - Perform fasting labs. - Schedule mammogram for October. - pt refuses vaccines today  Elevated blood pressure with possible renal involvement Blood pressure readings elevated both at home and in office. Possible renal involvement due to past abnormal kidney function. Discussed home monitoring and potential medication need. - Check blood pressure with manual cuff after she has been seated. - Compare current kidney function with last year's results. - Bring home blood pressure cuff to office for accuracy check.  Mitral valve regurgitation, mild Mild mitral valve regurgitation noted on echocardiogram from June 2023. No significant changes observed.  Prediabetes A1c slightly elevated. Discussed dietary habits and exercise routine.  Mixed hyperlipidemia Cholesterol slightly elevated. Discussed dietary habits and exercise routine.  Vitamin D  deficiency Vitamin D  deficient, not taking supplements.  Sun exposure noted during summer. - Check vitamin D  levels during fasting labs.  Urinary stress incontinence post-hysterectomy Experiencing stress incontinence, likely due to post-hysterectomy status. - Refer to urogynecologist for evaluation of stress incontinence.          Benton LITTIE Gave, PA

## 2024-08-10 ENCOUNTER — Ambulatory Visit: Payer: Self-pay | Admitting: Urgent Care

## 2024-08-10 LAB — COMPREHENSIVE METABOLIC PANEL WITH GFR
ALT: 17 IU/L (ref 0–32)
AST: 17 IU/L (ref 0–40)
Albumin: 4.3 g/dL (ref 3.9–4.9)
Alkaline Phosphatase: 121 IU/L (ref 44–121)
BUN/Creatinine Ratio: 17 (ref 12–28)
BUN: 18 mg/dL (ref 8–27)
Bilirubin Total: 0.3 mg/dL (ref 0.0–1.2)
CO2: 22 mmol/L (ref 20–29)
Calcium: 9.4 mg/dL (ref 8.7–10.3)
Chloride: 101 mmol/L (ref 96–106)
Creatinine, Ser: 1.05 mg/dL — ABNORMAL HIGH (ref 0.57–1.00)
Globulin, Total: 2.2 g/dL (ref 1.5–4.5)
Glucose: 92 mg/dL (ref 70–99)
Potassium: 4.6 mmol/L (ref 3.5–5.2)
Sodium: 141 mmol/L (ref 134–144)
Total Protein: 6.5 g/dL (ref 6.0–8.5)
eGFR: 58 mL/min/1.73 — ABNORMAL LOW (ref >59)

## 2024-08-10 LAB — CBC WITH DIFFERENTIAL/PLATELET
Basophils Absolute: 0.1 x10E3/uL (ref 0.0–0.2)
Basos: 1 %
EOS (ABSOLUTE): 0.4 x10E3/uL (ref 0.0–0.4)
Eos: 7 %
Hematocrit: 42.6 % (ref 34.0–46.6)
Hemoglobin: 13.7 g/dL (ref 11.1–15.9)
Immature Grans (Abs): 0 x10E3/uL (ref 0.0–0.1)
Immature Granulocytes: 0 %
Lymphocytes Absolute: 2 x10E3/uL (ref 0.7–3.1)
Lymphs: 34 %
MCH: 28.3 pg (ref 26.6–33.0)
MCHC: 32.2 g/dL (ref 31.5–35.7)
MCV: 88 fL (ref 79–97)
Monocytes Absolute: 0.5 x10E3/uL (ref 0.1–0.9)
Monocytes: 8 %
Neutrophils Absolute: 2.9 x10E3/uL (ref 1.4–7.0)
Neutrophils: 50 %
Platelets: 254 x10E3/uL (ref 150–450)
RBC: 4.84 x10E6/uL (ref 3.77–5.28)
RDW: 13.3 % (ref 11.7–15.4)
WBC: 5.8 x10E3/uL (ref 3.4–10.8)

## 2024-08-10 LAB — HEMOGLOBIN A1C
Est. average glucose Bld gHb Est-mCnc: 114 mg/dL
Hgb A1c MFr Bld: 5.6 % (ref 4.8–5.6)

## 2024-08-10 LAB — LIPID PANEL
Chol/HDL Ratio: 3.6 ratio (ref 0.0–4.4)
Cholesterol, Total: 239 mg/dL — ABNORMAL HIGH (ref 100–199)
HDL: 67 mg/dL (ref >39)
LDL Chol Calc (NIH): 153 mg/dL — ABNORMAL HIGH (ref 0–99)
Triglycerides: 107 mg/dL (ref 0–149)
VLDL Cholesterol Cal: 19 mg/dL (ref 5–40)

## 2024-08-10 LAB — VITAMIN D 25 HYDROXY (VIT D DEFICIENCY, FRACTURES): Vit D, 25-Hydroxy: 39.5 ng/mL (ref 30.0–100.0)

## 2024-08-10 LAB — TSH: TSH: 1.85 u[IU]/mL (ref 0.450–4.500)

## 2024-08-30 ENCOUNTER — Other Ambulatory Visit: Payer: Self-pay | Admitting: Urgent Care

## 2024-08-30 DIAGNOSIS — Z1231 Encounter for screening mammogram for malignant neoplasm of breast: Secondary | ICD-10-CM

## 2024-09-01 DIAGNOSIS — K080 Exfoliation of teeth due to systemic causes: Secondary | ICD-10-CM | POA: Diagnosis not present

## 2024-09-30 ENCOUNTER — Encounter (HOSPITAL_BASED_OUTPATIENT_CLINIC_OR_DEPARTMENT_OTHER): Payer: Self-pay

## 2024-09-30 ENCOUNTER — Telehealth: Payer: Self-pay | Admitting: Urgent Care

## 2024-09-30 ENCOUNTER — Ambulatory Visit (HOSPITAL_BASED_OUTPATIENT_CLINIC_OR_DEPARTMENT_OTHER)
Admission: RE | Admit: 2024-09-30 | Discharge: 2024-09-30 | Disposition: A | Discharge status: Home / Self Care | Source: Ambulatory Visit | Attending: Urgent Care | Admitting: Urgent Care

## 2024-09-30 DIAGNOSIS — Z1231 Encounter for screening mammogram for malignant neoplasm of breast: Secondary | ICD-10-CM | POA: Diagnosis present

## 2024-09-30 NOTE — Telephone Encounter (Signed)
 Is okay to accept  her, I won't be able to see her until January

## 2024-09-30 NOTE — Telephone Encounter (Signed)
 Do we know if she was referred by a current Pt or a family member of a current Pt?

## 2024-09-30 NOTE — Telephone Encounter (Signed)
 Pt was wanting to know if Dr. Amon would be willing to take her on as a new pt if possible.

## 2024-09-30 NOTE — Telephone Encounter (Signed)
 Okay to schedule NP appt w/ Dr. Amon- please just make her aware that he will be out of the office and won't be able to see her until January 2026. Thank you

## 2024-10-04 ENCOUNTER — Ambulatory Visit: Payer: Self-pay | Admitting: Urgent Care

## 2024-10-20 ENCOUNTER — Ambulatory Visit (INDEPENDENT_AMBULATORY_CARE_PROVIDER_SITE_OTHER)

## 2024-10-20 DIAGNOSIS — Z13820 Encounter for screening for osteoporosis: Secondary | ICD-10-CM

## 2024-10-20 DIAGNOSIS — E559 Vitamin D deficiency, unspecified: Secondary | ICD-10-CM

## 2024-10-20 DIAGNOSIS — Z780 Asymptomatic menopausal state: Secondary | ICD-10-CM | POA: Diagnosis not present

## 2024-12-10 ENCOUNTER — Ambulatory Visit: Payer: Self-pay

## 2024-12-10 NOTE — Telephone Encounter (Signed)
 FYI Only or Action Required?: FYI only for provider: appointment scheduled on 12/13/24.  Patient was last seen in primary care on 08/09/2024 by Lowella Benton CROME, PA.  Called Nurse Triage reporting Back Pain.  Symptoms began 5-6 weeks ago.  Interventions attempted: OTC medications: Advil, Tylenol , massage, Biofreeze.  Symptoms are: gradually worsening.  Triage Disposition: See PCP When Office is Open (Within 3 Days)  Patient/caregiver understands and will follow disposition?: Yes             Copied from CRM #[id number]. Topic: Clinical - Red Word Triage >> Dec 10, 2024  8:35 AM Myrick T wrote: Red Word that prompted transfer to Nurse Triage: patent says she has been experiencing severe right side middle back pain that has gotten worse of the last 5-6 weeks. On a scale of 1-10 is a 10+.   ----------------------------------------------------------------------- From previous Reason for Contact - Scheduling: Patient/patient representative is calling to schedule an appointment. Refer to attachments for appointment information. Reason for Disposition  [1] MODERATE back pain (e.g., interferes with normal activities) AND [2] present > 3 days  Answer Assessment - Initial Assessment Questions Patient states she was going to go to urgent care but they told her they don't order MRI's. She states she would like an MRI, RN advised she will need to discuss with a provider first and be evaluated.  1. ONSET: When did the pain begin? (e.g., minutes, hours, days)     5-6 weeks. Worsening since last night after working out and playing basketball.  2. LOCATION: Where does it hurt? (upper, mid or lower back)     Right mid back.  3. SEVERITY: How bad is the pain?  (e.g., Scale 1-10; mild, moderate, or severe)     10+/10 with certain movements. At rest I'm fine.  4. PATTERN: Is the pain constant? (e.g., yes, no; constant, intermittent)      Comes and goes.  5. RADIATION: Does the  pain shoot into your legs or somewhere else?     No.  6. CAUSE:  What do you think is causing the back pain?      Right lat muscle thinks sprained but pain not resolving. She thinks it is torn now.  7. BACK OVERUSE:  Any recent lifting of heavy objects, strenuous work or exercise?     Strength trains at the gym daily; playing basketball.  8. MEDICINES: What have you taken so far for the pain? (e.g., nothing, acetaminophen , NSAIDS)     Advil, Tylenol , massage, Biofreeze  9. NEUROLOGIC SYMPTOMS: Do you have any weakness, numbness, or problems with bowel/bladder control?     No.  10. OTHER SYMPTOMS: Do you have any other symptoms? (e.g., fever, abdomen pain, burning with urination, blood in urine)       No. No LOC/fainting, abdominal pain, numbness in groin or rectum, loss of bowel or bladder control, urinary retention.  Protocols used: Back Pain-A-AH

## 2024-12-13 ENCOUNTER — Encounter: Payer: Self-pay | Admitting: Obstetrics and Gynecology

## 2024-12-13 ENCOUNTER — Ambulatory Visit: Admitting: Obstetrics and Gynecology

## 2024-12-13 ENCOUNTER — Encounter: Payer: Self-pay | Admitting: Family Medicine

## 2024-12-13 ENCOUNTER — Ambulatory Visit

## 2024-12-13 ENCOUNTER — Ambulatory Visit: Admitting: Family Medicine

## 2024-12-13 VITALS — BP 154/94 | HR 67 | Ht 60.0 in | Wt 130.4 lb

## 2024-12-13 VITALS — BP 165/76 | HR 66 | Ht 60.0 in | Wt 131.0 lb

## 2024-12-13 DIAGNOSIS — M549 Dorsalgia, unspecified: Secondary | ICD-10-CM

## 2024-12-13 DIAGNOSIS — R10A1 Flank pain, right side: Secondary | ICD-10-CM

## 2024-12-13 DIAGNOSIS — R3915 Urgency of urination: Secondary | ICD-10-CM | POA: Insufficient documentation

## 2024-12-13 DIAGNOSIS — R350 Frequency of micturition: Secondary | ICD-10-CM | POA: Diagnosis not present

## 2024-12-13 DIAGNOSIS — M546 Pain in thoracic spine: Secondary | ICD-10-CM | POA: Insufficient documentation

## 2024-12-13 DIAGNOSIS — N393 Stress incontinence (female) (male): Secondary | ICD-10-CM | POA: Insufficient documentation

## 2024-12-13 DIAGNOSIS — M542 Cervicalgia: Secondary | ICD-10-CM

## 2024-12-13 LAB — POCT URINALYSIS DIP (CLINITEK)
Bilirubin, UA: NEGATIVE
Blood, UA: NEGATIVE
Glucose, UA: NEGATIVE mg/dL
Ketones, POC UA: NEGATIVE mg/dL
Leukocytes, UA: NEGATIVE
Nitrite, UA: NEGATIVE
POC PROTEIN,UA: NEGATIVE
Spec Grav, UA: 1.025
Urobilinogen, UA: 0.2 U/dL
pH, UA: 6

## 2024-12-13 NOTE — Progress Notes (Signed)
 " New Patient Evaluation and Consultation  Referring Provider: Lowella Benton CROME, PA PCP: Lowella Benton CROME, [place] Date of Service: 12/13/2024  SUBJECTIVE Chief Complaint: New Patient (Initial Visit) (Linda Lam is a 69 y.o. female here today for urinary incontinence )  History of Present Illness: Linda Lam is a 69 y.o. White or Caucasian female seen in consultation at the request of PA Benton Lowella for evaluation of incontinence.    Urinary Symptoms: Leaks urine with cough/ sneeze, exercise, and with urgency Leaks 1 time(s) per day. SUI is worse, especially when bladder is full Pad use: none Patient is bothered by UI symptoms.  Day time voids 7.  Nocturia: 2 times per night to void. Voiding dysfunction:  empties bladder well.  Patient does not use a catheter to empty bladder.  When urinating, patient feels to push on her belly or vagina to empty bladder Drinks: 1 cup coffee in AM,  8 cups water (half regular water half seltzer) per day. Has some water overnight.   UTIs: 0 UTI's in the last year.   Denies history of blood in urine and kidney or bladder stones   Pelvic Organ Prolapse Symptoms:                  Patient Denies a feeling of a bulge the vaginal area.  Bowel Symptom: Bowel movements: 1 time(s) per day Stool consistency: soft  Straining: no.  Splinting: no.  Incomplete evacuation: no.  Patient Denies accidental bowel leakage / fecal incontinence Bowel regimen: none  Sexual Function Sexually active: yes.  Pain with sex: No  Pelvic Pain Denies pelvic pain   Past Medical History:  Past Medical History:  Diagnosis Date   Allergy 2019   Arthritis    knees   Heart murmur      Past Surgical History:   Past Surgical History:  Procedure Laterality Date   [name] Right 10/10/2022   Procedure: CHEVRON AND [name];  Surgeon: [name], MD;  Location: [facility];  Service: Orthopedics;  Laterality: Right;    BUNIONECTOMY Left    BUNIONECTOMY Right 10/10/2022   Procedure: RIGHT FOOT BUNION CORRECTION;  Surgeon: [name], MD;  Location: [facility];  Service: Orthopedics;  Laterality: Right;   COSMETIC SURGERY  05-16-13   blepharoplasty   HAMMERTOE RECONSTRUCTION WITH WEIL OSTEOTOMY Right 10/10/2022   Procedure: RIGHT SECOND HAMMERTOE CORRECTION WITH WEIL OSTEOTOMY;  Surgeon: [name], MD;  Location: [facility];  Service: Orthopedics;  Laterality: Right;   KNEE ARTHROSCOPY Left    VAGINAL HYSTERECTOMY     at 42     Past OB/GYN History: OB History  Gravida Para Term Preterm AB Living  2 2 2   2   SAB IAB Ectopic Multiple Live Births      2    # Outcome Date GA Lbr Len/2nd Weight Sex Type Anes PTL Lv  2 Term     M Vag-Spont   LIV  1 Term     F Vag-Spont   LIV   No significant tearing with deliveries S/p hysterectomy  Medications: Patient has a current medication list which includes the following prescription(s): tretinoin .   Allergies: Patient is allergic to latex and demerol [meperidine].   Social History: Social History[1]  Relationship status: married Patient lives with her spouse.   Patient is employed as an airline pilot. Regular exercise: Yes: daily History of abuse: No  Family History:   Family History  Problem  Relation Age of Onset   Hypertension Mother    Diabetes Mother    Liver disease Mother        NAFL   Rheum arthritis Mother    Stroke Father    Hypertension Sister    Diabetes Brother    Hypertension Brother    Diabetes Maternal Grandmother    Blindness Neg Hx    Renal cancer Neg Hx    Uterine cancer Neg Hx      Review of Systems: Review of Systems  Constitutional:  Negative for fever, malaise/fatigue and weight loss.  Respiratory:  Negative for cough, shortness of breath and wheezing.   Cardiovascular:  Negative for chest pain, palpitations and leg swelling.  Gastrointestinal:  Negative for abdominal pain and blood  in stool.  Genitourinary:  Negative for dysuria.  Musculoskeletal:  Positive for myalgias.  Skin:  Negative for rash.  Neurological:  Negative for dizziness and headaches.  Endo/Heme/Allergies:  Does not bruise/bleed easily.  Psychiatric/Behavioral:  Negative for depression. The patient is not nervous/anxious.      OBJECTIVE Physical Exam: Vitals:   12/13/24 0909  BP: (!) 154/94  Pulse: 67  Weight: 130 lb 6.4 oz (59.1 kg)  Height: 5' (1.524 m)    Physical Exam Vitals reviewed. Exam conducted with a chaperone present.  Constitutional:      General: She is not in acute distress. Pulmonary:     Effort: Pulmonary effort is normal.  Abdominal:     General: There is no distension.     Palpations: Abdomen is soft.     Tenderness: There is no abdominal tenderness. There is no rebound.  Musculoskeletal:        General: No swelling. Normal range of motion.  Skin:    General: Skin is warm and dry.     Findings: No rash.  Neurological:     Mental Status: She is alert and oriented to person, place, and time.  Psychiatric:        Mood and Affect: Mood normal.        Behavior: Behavior normal.      GU / Detailed Urogynecologic Evaluation:  Pelvic Exam: Normal external female genitalia; Bartholin's and Skene's glands normal in appearance; urethral meatus normal in appearance, no urethral masses or discharge.   CST: negative  s/p hysterectomy: Speculum exam reveals normal vaginal mucosa with  atrophy and normal vaginal cuff.  Adnexa no mass, fullness, tenderness.     Pelvic floor strength 0/V- unable to contract pelvic floor muscles  Pelvic floor musculature: Right levator non-tender, Right obturator non-tender, Left levator non-tender, Left obturator non-tender  POP-Q:   POP-Q  -3                                            Aa   -3                                           Ba  -7                                              C   2.5  Gh  4                                            Pb  7                                            tvl   -3                                            Ap  -3                                            Bp                                                 D      Rectal Exam:  deferred  Post-Void Residual (PVR) by Bladder Scan: In order to evaluate bladder emptying, we discussed obtaining a postvoid residual and patient agreed to this procedure.  Procedure: The ultrasound unit was placed on the patient's abdomen in the suprapubic region after the patient had voided.    Post Void Residual - 12/13/24 0921       Post Void Residual   Post Void Residual 90 mL           Laboratory Results: Lab Results  Component Value Date   COLORU yellow 12/13/2024   CLARITYU clear 12/13/2024   GLUCOSEUR negative 12/13/2024   BILIRUBINUR negative 12/13/2024   SPECGRAV 1.025 12/13/2024   RBCUR negative 12/13/2024   PHUR 6.0 12/13/2024   UROBILINOGEN 0.2 12/13/2024   LEUKOCYTESUR Negative 12/13/2024    Lab Results  Component Value Date   CREATININE 1.05 (H) 08/09/2024   CREATININE 1.07 (H) 08/08/2023   CREATININE 1.09 (H) 05/10/2022    Lab Results  Component Value Date   HGBA1C 5.6 08/09/2024    Lab Results  Component Value Date   HGB 13.7 08/09/2024     ASSESSMENT AND PLAN Linda Lam is a 69 y.o. with:  1. SUI (stress urinary incontinence, female)   2. Urinary urgency   3. Urinary frequency     SUI (stress urinary incontinence, female) Assessment & Plan: - For treatment of stress urinary incontinence,  non-surgical options include expectant management, weight loss, physical therapy, as well as a pessary.  Surgical options include a midurethral sling, and transurethral injection of a bulking agent. - She will start with pelvic PT then reassess symptoms, referral placed  Orders: -     AMB referral to rehabilitation  Urinary urgency Assessment & Plan: -  recommended decreasing bladder irritants such as caffeine and carbonated water   Urinary frequency -     POCT URINALYSIS DIP (CLINITEK)  Return 3 months   Linda LOISE Caper, MD        [1]  Social History Tobacco Use   Smoking status: Some Days    Current packs/day: 0.00  Types: Cigarettes    Last attempt to quit: 2023    Years since quitting: 3.0    Passive exposure: Yes   Smokeless tobacco: Never   Tobacco comments:    Light smoker  Vaping Use   Vaping status: Never Used  Substance Use Topics   Alcohol use: Yes    Alcohol/week: 2.0 standard drinks of alcohol    Types: 2 Standard drinks or equivalent per week    Comment: social   Drug use: No   "

## 2024-12-13 NOTE — Assessment & Plan Note (Signed)
 Area of pain is over the right thoracic spine and chest wall area.  X-rays of cervical and thoracic spine ordered.  She can try topical Voltaren over this area to see if this is helpful.  Discussed PT referral however she would like to hold on this for now.

## 2024-12-13 NOTE — Patient Instructions (Addendum)
Today we talked about ways to manage bladder urgency such as altering your diet to avoid irritative beverages and foods (bladder diet) as well as attempting to decrease stress and other exacerbating factors.    The Most Bothersome Foods* The Least Bothersome Foods*  Coffee - Regular & Decaf Tea - caffeinated Carbonated beverages - cola, non-colas, diet & caffeine-free Alcohols - Beer, Red Wine, White Wine, Champagne Fruits - Grapefruit, Lemon, Orange, Pineapple Fruit Juices - Cranberry, Grapefruit, Orange, Pineapple Vegetables - Tomato & Tomato Products Flavor Enhancers - Hot peppers, Spicy foods, Chili, Horseradish, Vinegar, Monosodium glutamate (MSG) Artificial Sweeteners - NutraSweet, Sweet 'N Low, Equal (sweetener), Saccharin Ethnic foods - Mexican, Thai, Indian food Water Milk - low-fat & whole Fruits - Bananas, Blueberries, Honeydew melon, Pears, Raisins, Watermelon Vegetables - Broccoli, Brussels Sprouts, Cabbage, Carrots, Cauliflower, Celery, Cucumber, Mushrooms, Peas, Radishes, Squash, Zucchini, White potatoes, Sweet potatoes & yams Poultry - Chicken, Eggs, Turkey, Meat - Beef, Pork, Lamb Seafood - Shrimp, Tuna fish, Salmon Grains - Oat, Rice Snacks - Pretzels, Popcorn  *Friedlander J. et al. Diet and its role in interstitial cystitis/bladder pain syndrome (IC/BPS) and comorbid conditions. [employer]. BJU Int. 2012 Jan 11.    For treatment of stress urinary incontinence, which is leakage with physical activity/movement/strainging/coughing, we discussed expectant management versus nonsurgical options versus surgery. Nonsurgical options include weight loss, physical therapy, as well as a pessary.  Surgical options include a midurethral sling, which is a synthetic mesh sling that acts like a hammock under the urethra to prevent leakage of urine, and transurethral injection of a bulking agent.  

## 2024-12-13 NOTE — Assessment & Plan Note (Signed)
-   recommended decreasing bladder irritants such as caffeine and carbonated water

## 2024-12-13 NOTE — Progress Notes (Signed)
 " Linda Lam - 69 y.o. female MRN [id number]  Date of birth: November 20, 1956  Subjective Chief Complaint  Patient presents with   Muscle Pain    HPI Linda Lam is a 69 y.o. female here today with complaint of pain in the right lateral chest wall and flank area area.  Pain started about 6 weeks ago.  She is very active and goes to the gym daily.  She notices increased pain with LAT pull downs and rows.  Does not recall any injury or overuse.  Denies numbness or tingling.  Ibuprofen has helped temporarily.  ROS:  A comprehensive ROS was completed and negative except as noted per HPI  Allergies[1]  Past Medical History:  Diagnosis Date   Allergy 2019   Arthritis    knees   Heart murmur     Past Surgical History:  Procedure Laterality Date   [name] Right 10/10/2022   Procedure: CHEVRON AND [name];  Surgeon: [name], MD;  Location: [facility];  Service: Orthopedics;  Laterality: Right;   BUNIONECTOMY Left    BUNIONECTOMY Right 10/10/2022   Procedure: RIGHT FOOT BUNION CORRECTION;  Surgeon: [name], MD;  Location: [facility];  Service: Orthopedics;  Laterality: Right;   COSMETIC SURGERY  05-16-13   blepharoplasty   HAMMERTOE RECONSTRUCTION WITH WEIL OSTEOTOMY Right 10/10/2022   Procedure: RIGHT SECOND HAMMERTOE CORRECTION WITH WEIL OSTEOTOMY;  Surgeon: [name], MD;  Location: [facility];  Service: Orthopedics;  Laterality: Right;   KNEE ARTHROSCOPY Left    VAGINAL HYSTERECTOMY     at 8    Social History   Socioeconomic History   Marital status: Married    Spouse name: Not on file   Number of children: Not on file   Years of education: Not on file   Highest education level: Bachelor's degree (e.g., BA, AB, BS)  Occupational History   Not on file  Tobacco Use   Smoking status: Some Days    Current packs/day: 0.00    Types: Cigarettes    Last attempt to quit: 2023    Years since  quitting: 3.0    Passive exposure: Yes   Smokeless tobacco: Never   Tobacco comments:    Light smoker  Vaping Use   Vaping status: Never Used  Substance and Sexual Activity   Alcohol use: Yes    Alcohol/week: 2.0 standard drinks of alcohol    Types: 2 Standard drinks or equivalent per week    Comment: social   Drug use: No   Sexual activity: Yes    Partners: Male    Birth control/protection: Surgical  Other Topics Concern   Not on file  Social History Narrative   Not on file   Social Drivers of Health   Tobacco Use: High Risk (12/13/2024)   Patient History    Smoking Tobacco Use: Some Days    Smokeless Tobacco Use: Never    Passive Exposure: Yes  Financial Resource Strain: Low Risk (12/12/2024)   Overall Financial Resource Strain (CARDIA)    Difficulty of Paying Living Expenses: Not hard at all  Food Insecurity: No Food Insecurity (12/12/2024)   Epic    Worried About Programme Researcher, Broadcasting/film/video in the Last Year: Never true    Ran Out of Food in the Last Year: Never true  Transportation Needs: No Transportation Needs (12/12/2024)   Epic    Lack of Transportation (Medical): No    Lack of Transportation (Non-Medical):  No  Physical Activity: Sufficiently Active (12/12/2024)   Exercise Vital Sign    Days of Exercise per Week: 7 days    Minutes of Exercise per Session: 40 min  Stress: No Stress Concern Present (12/12/2024)   [employer] of Occupational Health - Occupational Stress Questionnaire    Feeling of Stress: Not at all  Social Connections: Socially Integrated (12/12/2024)   Social Connection and Isolation Panel    Frequency of Communication with Friends and Family: More than three times a week    Frequency of Social Gatherings with Friends and Family: More than three times a week    Attends Religious Services: More than 4 times per year    Active Member of Clubs or Organizations: Yes    Attends Banker Meetings: More than 4 times per year    Marital Status:  Married  Depression (PHQ2-9): Low Risk (12/13/2024)   Depression (PHQ2-9)    PHQ-2 Score: 0  Alcohol Screen: Low Risk (12/12/2024)   Alcohol Screen    Last Alcohol Screening Score (AUDIT): 1  Housing: Low Risk (12/12/2024)   Epic    Unable to Pay for Housing in the Last Year: No    Number of Times Moved in the Last Year: 0    Homeless in the Last Year: No  Utilities: Not At Risk (08/06/2023)   AHC Utilities    Threatened with loss of utilities: No  Health Literacy: Not on file    Family History  Problem Relation Age of Onset   Hypertension Mother    Diabetes Mother    Liver disease Mother        NAFL   Rheum arthritis Mother    Stroke Father    Hypertension Sister    Diabetes Brother    Hypertension Brother    Diabetes Maternal Grandmother    Blindness Neg Hx    Renal cancer Neg Hx    Uterine cancer Neg Hx     Health Maintenance  Topic Date Due   Pneumococcal Vaccine: 50+ Years (1 of 2 - PCV) Never done   Zoster Vaccines- Shingrix (1 of 2) Never done   Medicare Annual Wellness (AWV)  08/06/2024   COVID-19 Vaccine (3 - Pfizer risk series) 12/29/2024 (Originally 11/08/2020)   Influenza Vaccine  03/01/2025 (Originally 07/02/2024)   Mammogram  09/30/2026   DTaP/Tdap/Td (2 - Td or Tdap) 01/22/2027   Colonoscopy  05/01/2027   Bone Density Scan  Completed   Hepatitis C Screening  Completed   Meningococcal B Vaccine  Aged Out   Fecal DNA (Cologuard)  Discontinued     ----------------------------------------------------------------------------------------------------------------------------------------------------------------------------------------------------------------- Physical Exam BP (!) 165/76   Pulse 66   Ht 5' (1.524 m)   Wt 131 lb (59.4 kg)   LMP 02/25/1998   SpO2 99%   BMI 25.58 kg/m   Physical Exam Constitutional:      Appearance: Normal appearance.  HENT:     Head: Normocephalic and atraumatic.  Cardiovascular:     Rate and Rhythm: Normal rate and  regular rhythm.  Pulmonary:     Effort: Pulmonary effort is normal.     Breath sounds: Normal breath sounds.  Musculoskeletal:     Cervical back: Neck supple.     Comments: Nontender along right chest wall.  Unable to recreate pain with range of motion or strength testing.  Neurological:     Mental Status: She is alert.     ------------------------------------------------------------------------------------------------------------------------------------------------------------------------------------------------------------------- Assessment and Plan  Acute right-sided thoracic back pain Area  of pain is over the right thoracic spine and chest wall area.  X-rays of cervical and thoracic spine ordered.  She can try topical Voltaren over this area to see if this is helpful.  Discussed PT referral however she would like to hold on this for now.   No orders of the defined types were placed in this encounter.   No follow-ups on file.         [1]  Allergies Allergen Reactions   Latex Other (See Comments)    Burning reaction   Demerol [Meperidine] Nausea And Vomiting    This was given via IV push.   "

## 2024-12-13 NOTE — Patient Instructions (Addendum)
" ° °  Try topical voltaren as needed over affected area.    "

## 2024-12-13 NOTE — Assessment & Plan Note (Signed)
-   For treatment of stress urinary incontinence,  non-surgical options include expectant management, weight loss, physical therapy, as well as a pessary.  Surgical options include a midurethral sling, and transurethral injection of a bulking agent. - She will start with pelvic PT then reassess symptoms, referral placed

## 2024-12-19 ENCOUNTER — Ambulatory Visit: Payer: Self-pay | Admitting: Family Medicine

## 2025-01-04 ENCOUNTER — Encounter: Payer: Self-pay | Admitting: Internal Medicine

## 2025-01-04 ENCOUNTER — Ambulatory Visit: Admitting: Internal Medicine

## 2025-01-04 VITALS — BP 138/78 | HR 71 | Temp 98.0°F | Resp 16 | Ht 60.0 in | Wt 131.0 lb

## 2025-01-04 DIAGNOSIS — R030 Elevated blood-pressure reading, without diagnosis of hypertension: Secondary | ICD-10-CM | POA: Insufficient documentation

## 2025-01-04 DIAGNOSIS — R011 Cardiac murmur, unspecified: Secondary | ICD-10-CM

## 2025-01-04 DIAGNOSIS — Z09 Encounter for follow-up examination after completed treatment for conditions other than malignant neoplasm: Secondary | ICD-10-CM | POA: Insufficient documentation

## 2025-01-04 NOTE — Assessment & Plan Note (Signed)
 New patient. Elevated BP:  Blood pressure occasionally elevated to 150 mmHg, often stress-related. Current reading 138/78 mmHg. No symptoms.  + FH  noted.  Plan: - Continue regular blood pressure monitoring. - Emphasized stress management techniques. - Encouraged low-salt diet and regular exercise. Hypercholesterolemia Cholesterol 153 mg/dL. 10-year cardiovascular risk 15%, qualifying for statin therapy. No symptoms.  Not ready to take medications, prefers lifestyle. - Recheck cholesterol levels in 3 months. - Encouraged low-fat diet, use of olive oil, and avoidance of excessive cheese consumption. Systolic murmur Soft heart murmur detected, primarily on the right side.  Echo 2023. Stress urinary incontinence Occasional urinary incontinence with sneezing. Recently consulted a urogynecologist. Social: I accepted her husband is my patient. Vaccine advise: Flu, COVID, PNM 20. Chart review: Colonoscopy November 2021: 1 polyp, tubular adenoma, next per GI RTC 3 months

## 2025-02-03 ENCOUNTER — Ambulatory Visit

## 2025-03-14 ENCOUNTER — Ambulatory Visit: Admitting: Obstetrics and Gynecology

## 2025-04-04 ENCOUNTER — Ambulatory Visit

## 2025-04-04 ENCOUNTER — Ambulatory Visit: Admitting: Internal Medicine
